# Patient Record
Sex: Male | Born: 1980 | Race: Black or African American | Hispanic: No | Marital: Single | Smoking: Current every day smoker
Health system: Southern US, Community
[De-identification: ages and names within clinical notes are randomized; demographics above are authoritative.]

## PROBLEM LIST (undated history)

## (undated) DIAGNOSIS — I498 Other specified cardiac arrhythmias: Secondary | ICD-10-CM

## (undated) DIAGNOSIS — R9431 Abnormal electrocardiogram [ECG] [EKG]: Secondary | ICD-10-CM

---

## 2001-08-27 ENCOUNTER — Emergency Department (HOSPITAL_COMMUNITY): Admission: EM | Admit: 2001-08-27 | Discharge: 2001-08-27 | Payer: Self-pay | Admitting: *Deleted

## 2002-10-02 ENCOUNTER — Emergency Department (HOSPITAL_COMMUNITY): Admission: EM | Admit: 2002-10-02 | Discharge: 2002-10-02 | Payer: Self-pay | Admitting: Emergency Medicine

## 2002-10-02 ENCOUNTER — Encounter: Payer: Self-pay | Admitting: Emergency Medicine

## 2008-07-07 ENCOUNTER — Emergency Department (HOSPITAL_COMMUNITY): Admission: EM | Admit: 2008-07-07 | Discharge: 2008-07-07 | Payer: Self-pay | Admitting: Emergency Medicine

## 2008-11-11 ENCOUNTER — Emergency Department (HOSPITAL_COMMUNITY): Admission: EM | Admit: 2008-11-11 | Discharge: 2008-11-11 | Payer: Self-pay | Admitting: Emergency Medicine

## 2010-04-25 ENCOUNTER — Inpatient Hospital Stay (HOSPITAL_COMMUNITY): Admission: AC | Admit: 2010-04-25 | Discharge: 2010-04-28 | Payer: Self-pay | Source: Home / Self Care

## 2010-05-03 ENCOUNTER — Emergency Department (HOSPITAL_COMMUNITY)
Admission: EM | Admit: 2010-05-03 | Discharge: 2010-05-04 | Payer: Self-pay | Source: Home / Self Care | Admitting: Emergency Medicine
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
## 2010-07-31 LAB — POCT I-STAT, CHEM 8
Hemoglobin: 16 g/dL (ref 13.0–17.0)
Potassium: 3.9 mEq/L (ref 3.5–5.1)
Sodium: 140 mEq/L (ref 135–145)
TCO2: 25 mmol/L (ref 0–100)

## 2010-07-31 LAB — URINALYSIS, ROUTINE W REFLEX MICROSCOPIC
Nitrite: NEGATIVE
Protein, ur: NEGATIVE mg/dL
Specific Gravity, Urine: 1.023 (ref 1.005–1.030)
Urobilinogen, UA: 1 mg/dL (ref 0.0–1.0)

## 2010-07-31 LAB — CBC
HCT: 44.4 % (ref 39.0–52.0)
Hemoglobin: 15.6 g/dL (ref 13.0–17.0)
RDW: 12.6 % (ref 11.5–15.5)
WBC: 10.3 10*3/uL (ref 4.0–10.5)

## 2010-07-31 LAB — DIFFERENTIAL
Basophils Absolute: 0 10*3/uL (ref 0.0–0.1)
Eosinophils Relative: 1 % (ref 0–5)
Lymphocytes Relative: 6 % — ABNORMAL LOW (ref 12–46)
Lymphs Abs: 0.6 10*3/uL — ABNORMAL LOW (ref 0.7–4.0)
Monocytes Absolute: 0.1 10*3/uL (ref 0.1–1.0)
Neutro Abs: 9.5 10*3/uL — ABNORMAL HIGH (ref 1.7–7.7)

## 2010-07-31 LAB — PROTIME-INR: Prothrombin Time: 12.6 seconds (ref 11.6–15.2)

## 2010-07-31 LAB — ETHANOL: Alcohol, Ethyl (B): <5 mg/dL (ref 0–10)

## 2010-07-31 LAB — URINE MICROSCOPIC-ADD ON

## 2010-07-31 LAB — RAPID URINE DRUG SCREEN, HOSP PERFORMED: Barbiturates: NOT DETECTED

## 2010-07-31 LAB — APTT: aPTT: 23 seconds — ABNORMAL LOW (ref 24–37)

## 2012-07-01 ENCOUNTER — Encounter (HOSPITAL_COMMUNITY): Payer: Self-pay | Admitting: Emergency Medicine

## 2012-07-01 ENCOUNTER — Emergency Department (INDEPENDENT_AMBULATORY_CARE_PROVIDER_SITE_OTHER)
Admission: EM | Admit: 2012-07-01 | Discharge: 2012-07-01 | Disposition: A | Discharge status: Home / Self Care | Payer: Self-pay | Source: Home / Self Care

## 2012-07-01 DIAGNOSIS — K130 Diseases of lips: Secondary | ICD-10-CM

## 2012-07-01 MED ORDER — CEFTRIAXONE SODIUM 1 G IJ SOLR
INTRAMUSCULAR | Status: AC
  Filled 2012-07-01: qty 10

## 2012-07-01 MED ORDER — HYDROCODONE-ACETAMINOPHEN 7.5-325 MG PO TABS: 1.0000 | ORAL_TABLET | ORAL | Status: DC | PRN

## 2012-07-01 MED ORDER — LIDOCAINE HCL (PF) 1 % IJ SOLN
INTRAMUSCULAR | Status: AC
  Filled 2012-07-01: qty 5

## 2012-07-01 MED ORDER — CEFTRIAXONE SODIUM 1 G IJ SOLR
1.0000 g | Freq: Once | INTRAMUSCULAR | Status: AC
  Administered 2012-07-01: 1 g via INTRAMUSCULAR

## 2012-07-01 MED ORDER — CLINDAMYCIN HCL 300 MG PO CAPS: 300.0000 mg | ORAL_CAPSULE | Freq: Two times a day (BID) | ORAL | Status: DC

## 2012-07-01 NOTE — ED Notes (Signed)
Discharge pending administration of antibiotic and post wait to ensure no adverse reaction

## 2012-07-01 NOTE — ED Provider Notes (Signed)
History     CSN: [id number]  Arrival date & time 07/01/12  1017   None     Chief Complaint  Patient presents with  . Oral Pain    (Consider location/radiation/quality/duration/timing/severity/associated sxs/prior treatment) HPI Comments: 32 year old man states he woke up 2 days ago with swelling and pain of the upper lip. It first started out as a small "bump". And it has enlarged over the past 24 hours. He is complaining of localized pain in the middle of the upper lip associated with swelling of the upper lip. pain radiates cephalad along the para nasal borders.  . There is no tenderness or swelling in the facial areas.   No past medical history on file.  No past surgical history on file.  No family history on file.  History  Substance Use Topics  . Smoking status: Not on file  . Smokeless tobacco: Not on file  . Alcohol Use: Not on file      Review of Systems  Constitutional: Negative.   HENT: Positive for facial swelling. Negative for congestion, sore throat, neck pain and neck stiffness.   Respiratory: Negative.   Skin: Negative.   Neurological: Negative.     Allergies  Review of patient's allergies indicates not on file.  Home Medications   Current Outpatient Rx  Name  Route  Sig  Dispense  Refill  . clindamycin (CLEOCIN) 300 MG capsule   Oral   Take 1 capsule (300 mg total) by mouth 2 (two) times daily. X 7 days   14 capsule   0     BP 131/85  Pulse 68  Temp(Src) 99.1 F (37.3 C) (Oral)  Resp 16  SpO2 100%  Physical Exam  Nursing note and vitals reviewed. Constitutional: He is oriented to person, place, and time. He appears well-developed and well-nourished.  HENT:  Head: Normocephalic and atraumatic.  Nose: Nose normal.  Mouth/Throat: Oropharynx is clear and moist. No oropharyngeal exudate.  There is no swelling, erythema or other abnormalities in the intraoral or pharyngeal areas and structures. No dental tenderness, no abscess formation  or dental pain. Airway is widely patent.  Eyes: Conjunctivae and EOM are normal. Pupils are equal, round, and reactive to light.  Neck: Normal range of motion. Neck supple.  Cardiovascular: Normal rate.   Pulmonary/Chest: Effort normal.  Lymphadenopathy:    He has no cervical adenopathy.  Neurological: He is alert and oriented to person, place, and time. He exhibits normal muscle tone.  Skin: Skin is warm and dry.  As in history of present illness. Swelling of the upper lip and induration in the middle aspect of the upper lip. At the superior vermilion border there is a 2 mm area of crusting. This most likely is the source of infection. No erythema or lymphangitis.  Psychiatric: He has a normal mood and affect.    ED Course  Procedures (including critical care time)  Labs Reviewed - No data to display No results found.   1. Lip abscess       MDM  The swelling involves most of the upper lip but the induration and tenderness is primarily in the middle aspect. It is so painful and tender it is difficult to touch and examined. I will first treat him with antibiotics, Rocephin 1 g IM and clindamycin 300 mg twice a day for 7 days. He is to apply warm compresses. If he is not improving in 2 days or this is getting worse in 24hrs he has been  instructed to go to the emergency department. This would be very difficult and painful to perform in the urgent care. I suspect anesthesia would be best performed with a nerve block rather than injection into the lip itself. And it certainly possibly he may need analgesics, possibly IV or even conscious sedation  to have this I&D.        Hayden Rasmussen, NP 07/01/12 1131

## 2012-07-01 NOTE — ED Notes (Signed)
Patient aware of post injection delay.  Patient sipping ginger ale

## 2012-07-01 NOTE — ED Provider Notes (Signed)
Medical screening examination/treatment/procedure(s) were performed by non-physician practitioner and as supervising physician I was immediately available for consultation/collaboration.  David Keller, M.D.  David C Keller, MD 07/01/12 2321 

## 2012-07-01 NOTE — ED Notes (Signed)
Mouth swelling for 2 days.  No tongue swelling or difficulty breathing.  No history of this swelling before.

## 2015-04-22 ENCOUNTER — Emergency Department (HOSPITAL_COMMUNITY): Payer: Self-pay

## 2015-04-22 ENCOUNTER — Encounter (HOSPITAL_COMMUNITY): Payer: Self-pay | Admitting: Family Medicine

## 2015-04-22 ENCOUNTER — Emergency Department (HOSPITAL_COMMUNITY)
Admission: EM | Admit: 2015-04-22 | Discharge: 2015-04-22 | Disposition: A | Discharge status: Home / Self Care | Payer: Self-pay | Attending: Emergency Medicine | Admitting: Emergency Medicine

## 2015-04-22 DIAGNOSIS — R51 Headache: Secondary | ICD-10-CM | POA: Insufficient documentation

## 2015-04-22 DIAGNOSIS — H53149 Visual discomfort, unspecified: Secondary | ICD-10-CM | POA: Insufficient documentation

## 2015-04-22 DIAGNOSIS — F17210 Nicotine dependence, cigarettes, uncomplicated: Secondary | ICD-10-CM | POA: Insufficient documentation

## 2015-04-22 DIAGNOSIS — R519 Headache, unspecified: Secondary | ICD-10-CM

## 2015-04-22 MED ORDER — KETOROLAC TROMETHAMINE 60 MG/2ML IM SOLN
60.0000 mg | Freq: Once | INTRAMUSCULAR | Status: DC
  Filled 2015-04-22: qty 2

## 2015-04-22 MED ORDER — METOCLOPRAMIDE HCL 5 MG/ML IJ SOLN
10.0000 mg | INTRAMUSCULAR | Status: AC
  Administered 2015-04-22: 10 mg via INTRAVENOUS
  Filled 2015-04-22: qty 2

## 2015-04-22 MED ORDER — DIPHENHYDRAMINE HCL 50 MG/ML IJ SOLN
25.0000 mg | Freq: Once | INTRAMUSCULAR | Status: AC
  Administered 2015-04-22: 25 mg via INTRAVENOUS
  Filled 2015-04-22: qty 1

## 2015-04-22 MED ORDER — KETOROLAC TROMETHAMINE 30 MG/ML IJ SOLN
30.0000 mg | Freq: Once | INTRAMUSCULAR | Status: AC
  Administered 2015-04-22: 30 mg via INTRAVENOUS
  Filled 2015-04-22: qty 1

## 2015-04-22 MED ORDER — BUTALBITAL-APAP-CAFFEINE 50-325-40 MG PO TABS: 1.0000 | ORAL_TABLET | Freq: Three times a day (TID) | ORAL | Status: AC | PRN

## 2015-04-22 MED ORDER — FLUTICASONE PROPIONATE 50 MCG/ACT NA SUSP
2.0000 | Freq: Every day | NASAL | Status: DC
  Administered 2015-04-22: 2 via NASAL
  Filled 2015-04-22: qty 16

## 2015-04-22 MED ORDER — CETIRIZINE HCL 10 MG PO TABS: 10.0000 mg | ORAL_TABLET | Freq: Every day | ORAL | Status: DC

## 2015-04-22 MED ORDER — PROCHLORPERAZINE EDISYLATE 5 MG/ML IJ SOLN
10.0000 mg | Freq: Once | INTRAMUSCULAR | Status: AC
  Administered 2015-04-22: 10 mg via INTRAVENOUS
  Filled 2015-04-22: qty 2

## 2015-04-22 NOTE — ED Notes (Signed)
Pt is complaining of a intermittent  headache that started 6 days ago. Pain describe as throbbing/. Pt has photosenitivity.

## 2015-04-22 NOTE — Discharge Instructions (Signed)
Sinus Headache A sinus headache occurs when the paranasal sinuses become clogged or swollen. Paranasal sinuses are air pockets within the bones of the face. Sinus headaches can range from mild to severe. CAUSES A sinus headache can result from various conditions that affect the sinuses, such as:  Colds.  Sinus infections.  Allergies. SYMPTOMS The main symptom of this condition is a headache that may feel like pain or pressure in the face, forehead, ears, or upper teeth. People who have a sinus headache often have other symptoms, such as:  Congested or runny nose.  Fever.  Inability to smell. Weather changes can make symptoms worse. DIAGNOSIS This condition may be diagnosed based on:  A physical exam and medical history.  Imaging tests, such as a CT scan and MRI, to check for problems with the sinuses.  A specialist may look into the sinuses with a tool that has a camera (endoscopy). TREATMENT Treatment for this condition depends on the cause.  Sinus pain that is caused by a sinus infection may be treated with antibiotic medicine.  Sinus pain that is caused by allergies may be helped by allergy medicines (antihistamines) and medicated nasal sprays.  Sinus pain that is caused by congestion may be helped by flushing the nose and sinuses with saline solution. HOME CARE INSTRUCTIONS  Take medicines only as directed by your health care provider.  If you were prescribed an antibiotic medicine, finish all of it even if you start to feel better.  If you have congestion, use a nasal spray to help reduce pressure.  If directed, apply a warm, moist washcloth to your face to help relieve pain. SEEK MEDICAL CARE IF:  You have headaches more than one time each week.  You have sensitivity to light or sound.  You have a fever.  You feel sick to your stomach (nauseous) or you throw up (vomit).  Your headaches do not get better with treatment. Many people think that they have a  sinus headache when they actually have migraines or tension headaches. SEEK IMMEDIATE MEDICAL CARE IF:  You have vision problems.  You have sudden, severe pain in your face or head.  You have a seizure.  You are confused.  You have a stiff neck.   This information is not intended to replace advice given to you by your health care provider. Make sure you discuss any questions you have with your health care provider.   Document Released: 05/14/2004 Document Revised: 08/21/2014 Document Reviewed: 04/02/2014 Elsevier Interactive Patient Education 2016 [employer]  

## 2015-04-22 NOTE — ED Provider Notes (Signed)
CSN: [id number]     Arrival date & time 04/22/15  8413 History   First MD Initiated Contact with Patient 04/22/15 0249     Chief Complaint  Patient presents with  . Headache     (Consider location/radiation/quality/duration/timing/severity/associated sxs/prior Treatment) HPI Comments: 35 year old male presents to the emergency department for evaluation of a right-sided temporal, nonradiating headache which began 6 days ago. Pain has been throbbing and intermittent. It has relieved slightly with ibuprofen, but will worsen when the medication wears off. Patient states that he has never had a similar headache in the past. He does report photosensitivity and denies fever, vision loss, tinnitus or hearing loss, extremity numbness or weakness, or nausea/vomiting. No neck stiffness or hx of head injury/trauma.  Patient is a 35 y.o. male presenting with headaches. The history is provided by the patient. No language interpreter was used.  Headache Associated symptoms: photophobia   Associated symptoms: no fever, no hearing loss, no neck pain and no vomiting     History reviewed. No pertinent past medical history. History reviewed. No pertinent past surgical history. History reviewed. No pertinent family history. Social History  Substance Use Topics  . Smoking status: Current Every Day Smoker -- 0.25 packs/day    Types: Cigarettes  . Smokeless tobacco: None  . Alcohol Use: Yes     Comment: "Every now and then"     Review of Systems  Constitutional: Negative for fever.  HENT: Negative for hearing loss and tinnitus.   Eyes: Positive for photophobia. Negative for visual disturbance.  Gastrointestinal: Negative for vomiting.  Musculoskeletal: Negative for neck pain.  Neurological: Positive for headaches.  All other systems reviewed and are negative.   Allergies  Review of patient's allergies indicates no known allergies.  Home Medications   Prior to Admission medications   Not on File    BP 103/69 mmHg  Pulse 64  Temp(Src) 98.2 F (36.8 C) (Oral)  Resp 20  SpO2 100%   Physical Exam  Constitutional: He is oriented to person, place, and time. He appears well-developed and well-nourished. No distress.  Nontoxic/nonseptic appearing  HENT:  Head: Normocephalic and atraumatic.  Mouth/Throat: Oropharynx is clear and moist. No oropharyngeal exudate.  Symmetric rise of the uvula with phonation  Eyes: Conjunctivae and EOM are normal. Pupils are equal, round, and reactive to light. No scleral icterus.  Pupils equal round and reactive to light bilaterally. EOMs normal.  Neck: Normal range of motion.  No nuchal rigidity or meningismus  Cardiovascular: Normal rate, regular rhythm and intact distal pulses.   Pulmonary/Chest: Effort normal. No respiratory distress. He has no wheezes.  Respirations even and unlabored  Musculoskeletal: Normal range of motion.  Neurological: He is alert and oriented to person, place, and time. No cranial nerve deficit. He exhibits normal muscle tone. Coordination normal.  GCS 15. Speech is goal oriented. No cranial nerve deficits appreciated; symmetric eyebrow raise, no facial drooping, tongue midline. Patient has equal grip strength bilaterally with 5/5 strength against resistance in all major muscle groups bilaterally. Sensation to light touch intact. Patient moves extremities without ataxia. He ambulates with steady gait.  Skin: Skin is warm and dry. No rash noted. He is not diaphoretic. No erythema. No pallor.  Psychiatric: He has a normal mood and affect. His behavior is normal.  Nursing note and vitals reviewed.   ED Course  Procedures (including critical care time) Labs Review Labs Reviewed - No data to display  Imaging Review Ct Head Wo Contrast  04/22/2015  CLINICAL DATA:  Acute onset of intermittent headache, worse on the right. Photophobia. Initial encounter. EXAM: CT HEAD WITHOUT CONTRAST TECHNIQUE: Contiguous axial images were  obtained from the base of the skull through the vertex without intravenous contrast. COMPARISON:  CT of the head performed 07/07/2008 FINDINGS: There is no evidence of acute infarction, mass lesion, or intra- or extra-axial hemorrhage on CT. The posterior fossa, including the cerebellum, brainstem and fourth ventricle, is within normal limits. The third and lateral ventricles, and basal ganglia are unremarkable in appearance. The cerebral hemispheres are symmetric in appearance, with normal gray-white differentiation. No mass effect or midline shift is seen. There is no evidence of fracture; there is incomplete fusion of the posterior arch of C1. The visualized portions of the orbits are within normal limits. A mucus retention cyst or polyp is noted at the right maxillary sinus. The remaining paranasal sinuses and mastoid air cells are well-aerated. No significant soft tissue abnormalities are seen. IMPRESSION: 1. No acute intracranial pathology seen on CT. 2. Mucus retention cyst or polyp at the right maxillary sinus. Electronically Signed   By: Roanna RaiderJeffery  Chang M.D.   On: 04/22/2015 06:04   I have personally reviewed and evaluated these images and lab results as part of my medical decision-making.   EKG Interpretation None      6:28 AM Patient reports improvement in his headache since receiving Toradol and Reglan. His CT shows mucus retention cyst or polyp at the right maxillary sinus without any other acute intracranial pathology. Will order Compazine and Benadryl for continued improvement as well as Flonase which the patient can take home with him. Anticipate discharge following further treatment.   Medications  ketorolac (TORADOL) 30 MG/ML injection 30 mg (not administered)  metoCLOPramide (REGLAN) injection 10 mg (not administered)  prochlorperazine (COMPAZINE) injection 10 mg (not administered)  diphenhydrAMINE (BENADRYL) injection 25 mg (not administered)  fluticasone (FLONASE) 50 MCG/ACT  nasal spray 2 spray (not administered)    MDM   Final diagnoses:  Sinus headache    35 year old male presents to the emergency department for evaluation of an intermittent headache over the past 6 days. His neurologic exam is nonfocal. He has no fever, nuchal rigidity, or meningismus to suggest meningitis. Symptoms improved with Toradol and Reglan. Patient requesting a CT scan which shows no acute intracranial pathology. There is evidence of mucus retention cyst versus polyp in the right maxillary sinus. Will treat for sinus headache with Zyrtec and Fioricet as an outpatient. Patient referred to his primary care doctor for follow-up. Return precautions given at discharge. Patient discharged in satisfactory condition with no unaddressed concerns.   Filed Vitals:   04/22/15 0241 04/22/15 0640  BP: 103/69 107/79  Pulse: 64 59  Temp: 98.2 F (36.8 C)   TempSrc: Oral   Resp: 20 16  SpO2: 100% 99%       Antony MaduraKelly Demontrez Rindfleisch, PA-C 04/22/15 [id number]  April Palumbo, MD 04/22/15 [phone]

## 2015-04-22 NOTE — ED Notes (Signed)
Pt refused IM injection  Pt states he wants po medication and wants his head scanned to find out what is causing his headaches  Pt states something is not right

## 2016-03-25 IMAGING — CT CT HEAD W/O CM
2 series · 15 of 30 positions shown, 19 images · non-contrast
Comparison: CT of the head performed 07/07/2008

CLINICAL DATA: Acute onset of intermittent headache, worse on the
right. Photophobia. Initial encounter.

EXAM:
CT HEAD WITHOUT CONTRAST
TECHNIQUE: Contiguous axial images were obtained from the base of the skull
through the vertex without intravenous contrast.

[Series 2: head w/o · axial · non-contrast · 0.45mm/px · z∈[-233,-98]mm · 13 of 33 slices shown, 17 images]
[im 3/33  brain]
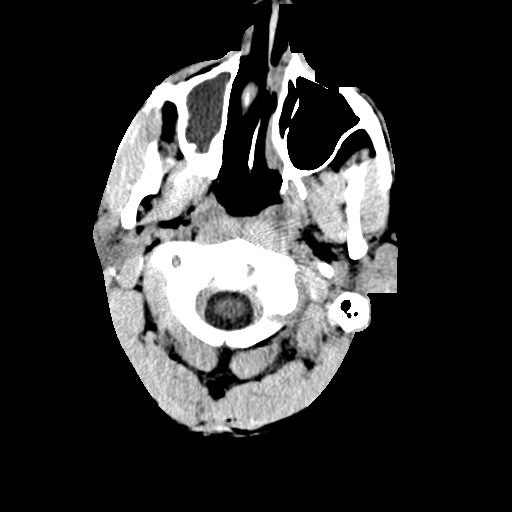
[im 3/33  bone]
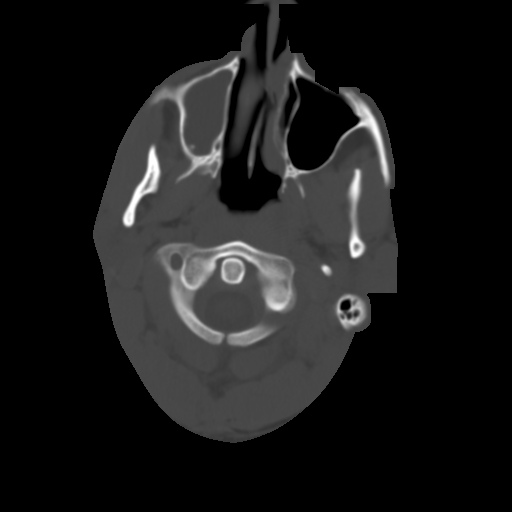
[im 5/33  brain]
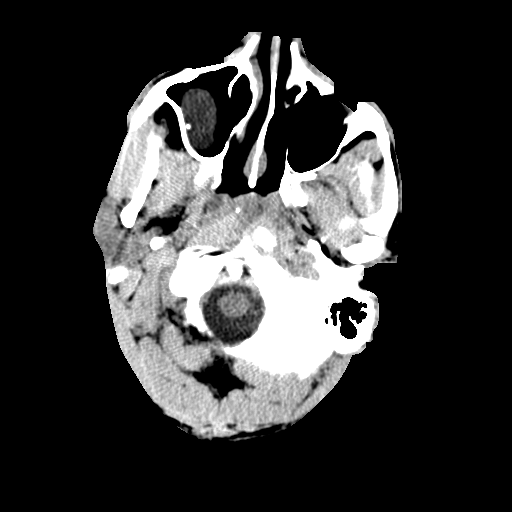
[im 7/33  brain]
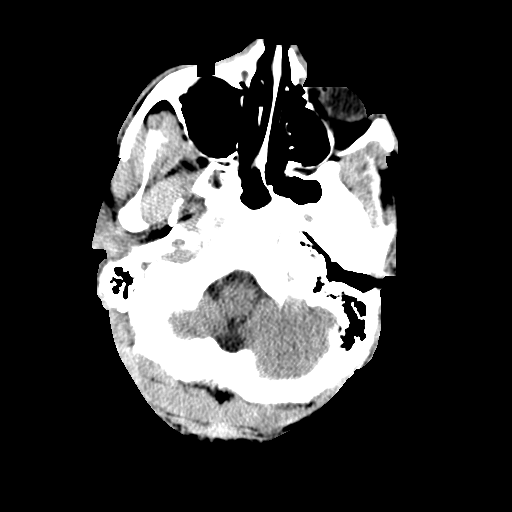
[im 10/33  brain]
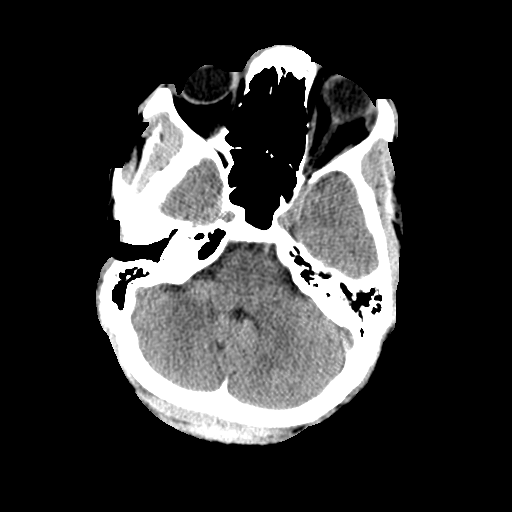
[im 12/33  brain]
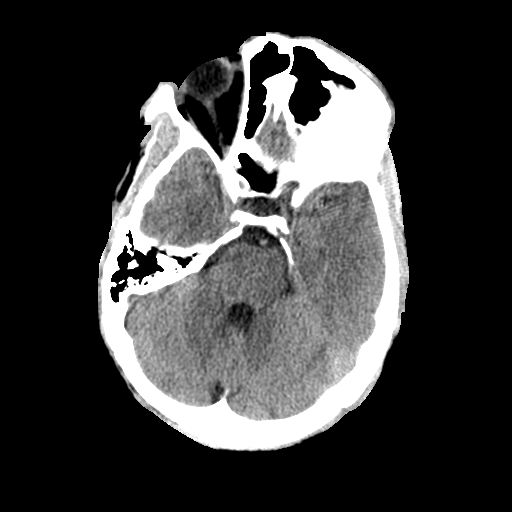
[im 12/33  bone]
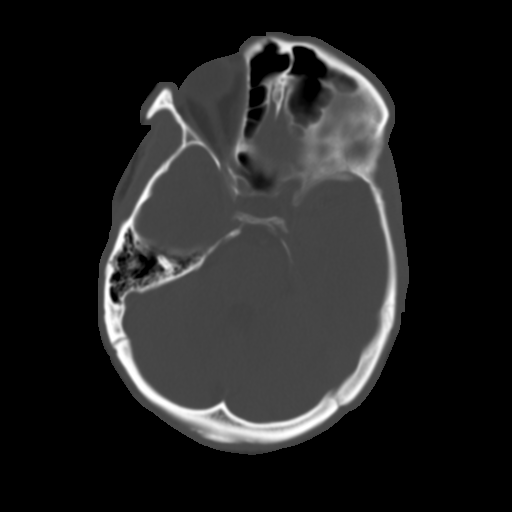
[im 14/33  brain]
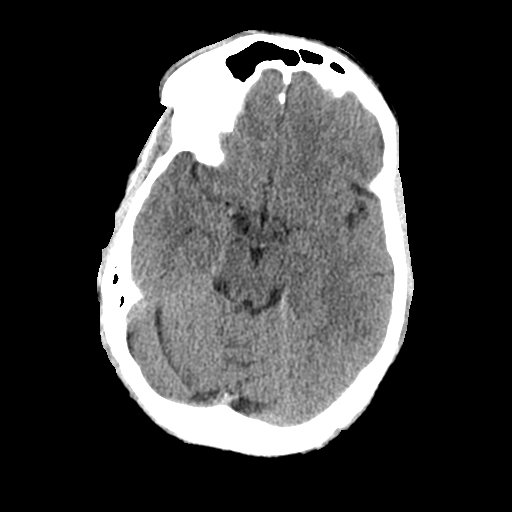
[im 17/33  brain]
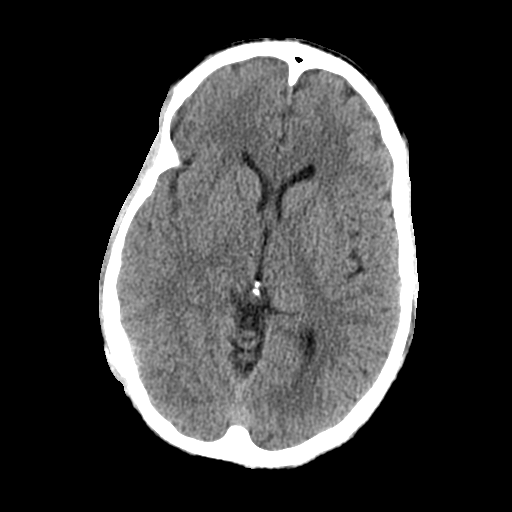
[im 19/33  brain]
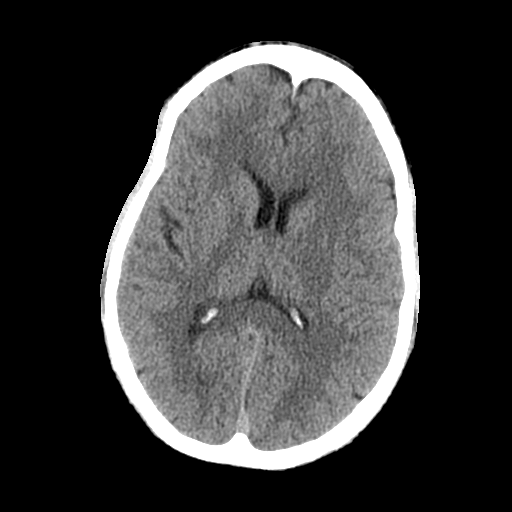
[im 21/33  brain]
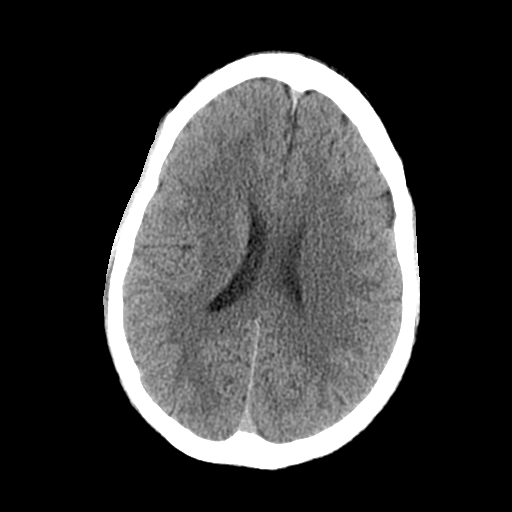
[im 21/33  bone]
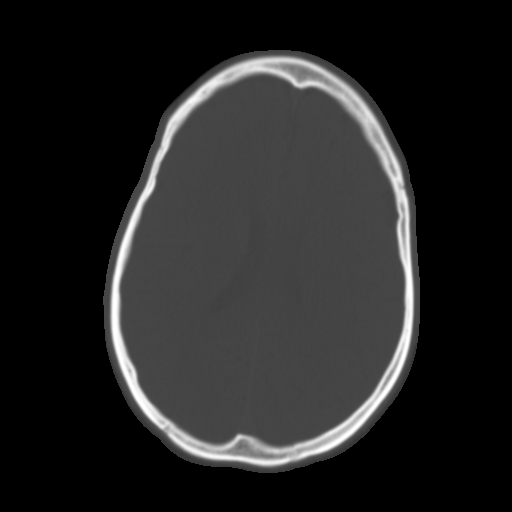
[im 23/33  brain]
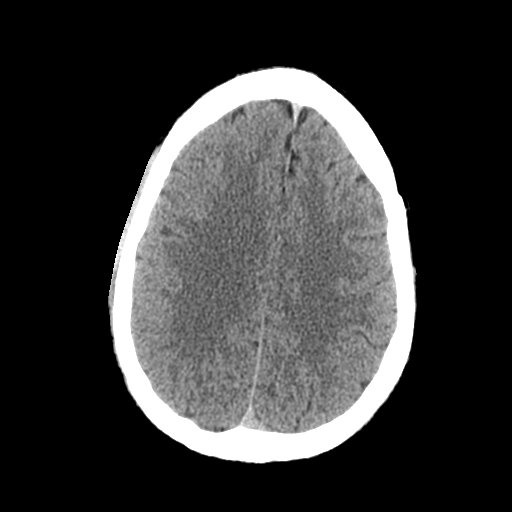
[im 26/33  brain]
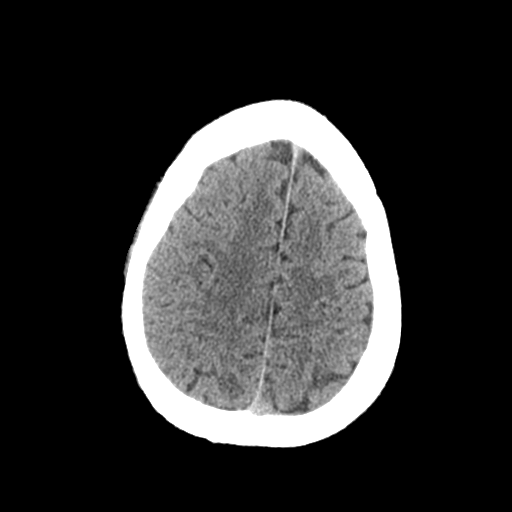
[im 28/33  brain]
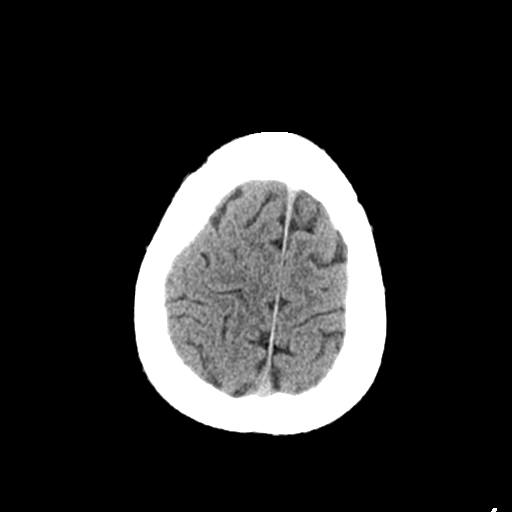
[im 30/33  brain]
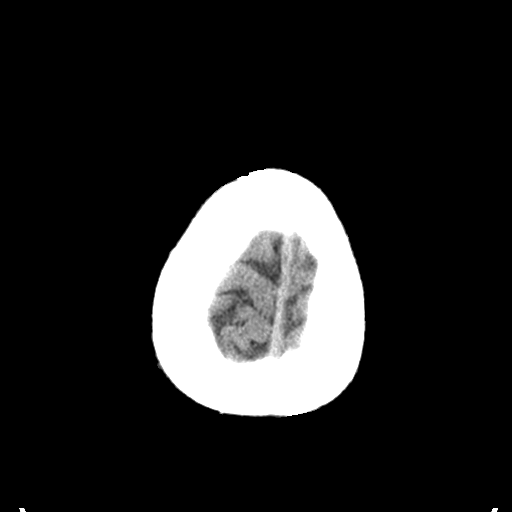
[im 30/33  bone]
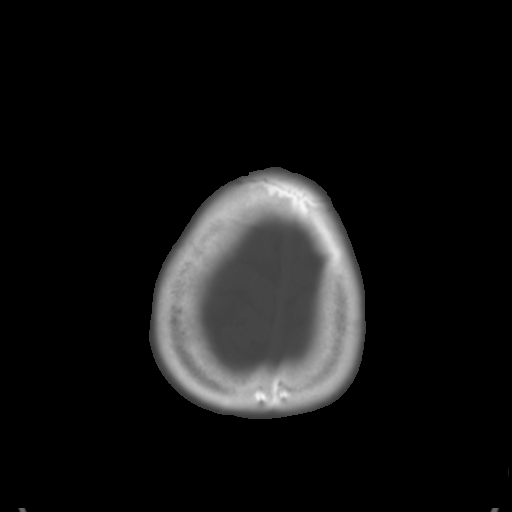

[Series 3: bone windows · axial · 0.45mm/px · z∈[-233,-213]mm · 2 of 33 slices shown]
[im 3/33  bone]
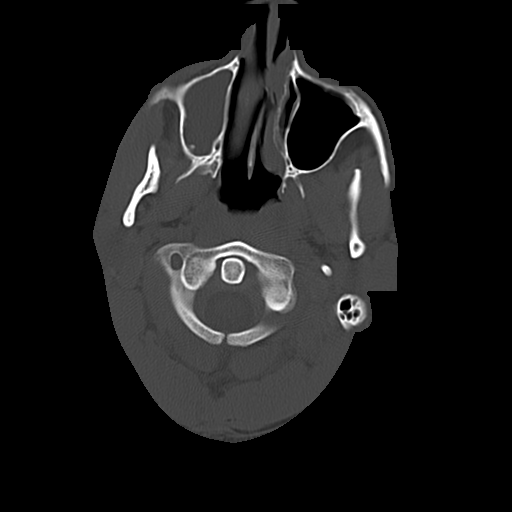
[im 7/33  bone]
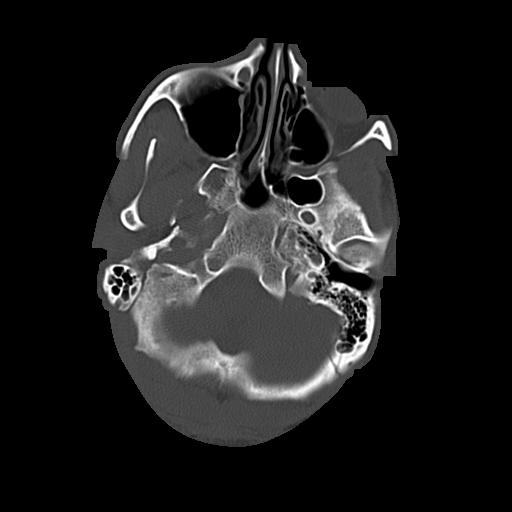

[15 of 30 positions shown; findings below may reference images not displayed]

FINDINGS: There is no evidence of acute infarction, mass lesion, or intra- or
extra-axial hemorrhage on CT.

The posterior fossa, including the cerebellum, brainstem and fourth
ventricle, is within normal limits. The third and lateral
ventricles, and basal ganglia are unremarkable in appearance. The
cerebral hemispheres are symmetric in appearance, with normal
gray-white differentiation. No mass effect or midline shift is seen.

There is no evidence of fracture; there is incomplete fusion of the
posterior arch of C1. The visualized portions of the orbits are
within normal limits. A mucus retention cyst or polyp is noted at
the right maxillary sinus. The remaining paranasal sinuses and
mastoid air cells are well-aerated. No significant soft tissue
abnormalities are seen.
IMPRESSION: 1. No acute intracranial pathology seen on CT.
2. Mucus retention cyst or polyp at the right maxillary sinus.

## 2018-03-03 ENCOUNTER — Emergency Department (HOSPITAL_COMMUNITY)
Admission: EM | Admit: 2018-03-03 | Discharge: 2018-03-03 | Disposition: A | Discharge status: Home / Self Care | Payer: Self-pay | Attending: Emergency Medicine | Admitting: Emergency Medicine

## 2018-03-03 ENCOUNTER — Encounter (HOSPITAL_COMMUNITY): Payer: Self-pay

## 2018-03-03 DIAGNOSIS — F17210 Nicotine dependence, cigarettes, uncomplicated: Secondary | ICD-10-CM | POA: Insufficient documentation

## 2018-03-03 DIAGNOSIS — Z79899 Other long term (current) drug therapy: Secondary | ICD-10-CM | POA: Insufficient documentation

## 2018-03-03 DIAGNOSIS — R9431 Abnormal electrocardiogram [ECG] [EKG]: Secondary | ICD-10-CM | POA: Insufficient documentation

## 2018-03-03 HISTORY — DX: Abnormal electrocardiogram (ECG) (EKG): R94.31

## 2018-03-03 LAB — CBC
HCT: 42.9 % (ref 39.0–52.0)
HEMOGLOBIN: 13.6 g/dL (ref 13.0–17.0)
MCH: 30.2 pg (ref 26.0–34.0)
MCHC: 31.7 g/dL (ref 30.0–36.0)
MCV: 95.3 fL (ref 80.0–100.0)
NRBC: 0 % (ref 0.0–0.2)
PLATELETS: 259 10*3/uL (ref 150–400)
RBC: 4.5 MIL/uL (ref 4.22–5.81)
RDW: 11.9 % (ref 11.5–15.5)
WBC: 4.2 10*3/uL (ref 4.0–10.5)

## 2018-03-03 LAB — BASIC METABOLIC PANEL
ANION GAP: 8 (ref 5–15)
BUN: 10 mg/dL (ref 6–20)
CO2: 24 mmol/L (ref 22–32)
Calcium: 9.1 mg/dL (ref 8.9–10.3)
Chloride: 106 mmol/L (ref 98–111)
Creatinine, Ser: 1.16 mg/dL (ref 0.61–1.24)
GLUCOSE: 103 mg/dL — AB (ref 70–99)
POTASSIUM: 4.2 mmol/L (ref 3.5–5.1)
SODIUM: 138 mmol/L (ref 135–145)

## 2018-03-03 LAB — I-STAT TROPONIN, ED: TROPONIN I, POC: 0 ng/mL (ref 0.00–0.08)

## 2018-03-03 NOTE — ED Notes (Signed)
Patient verbalizes understanding of discharge instructions. Opportunity for questioning and answers were provided. Armband removed by staff, pt discharged from ED.  

## 2018-03-03 NOTE — ED Provider Notes (Signed)
[facility] EMERGENCY DEPARTMENT Provider Note   CSN: [id number] Arrival date & time: 03/03/18  7829     History   Chief Complaint Chief Complaint  Patient presents with  . Abnormal ECG    HPI [name] [name] is a 37 y.o. male.  HPI   Patient is a 37 year old male with no known past medical history who presents the emergency department today for evaluation of an abnormal ECG.  Patient states that he was seen at a clinic for a medical trial and had an EKG done on 11/2 and 11/12 of this year.  At this visit he was told that he had an EKG abnormality and was advised to come to the ED immediately.  He states that he was unable to come to the ED until today.  He denies any current chest pain, shortness of breath or other symptoms.  He states he occasionally gets midsternal chest pain that he attributes to acid reflux.  It only occurs when he eats.  He denies exertional chest pain.  He denies any early known family history of cardiac disease or sudden death.  States that he uses marijuana and alcohol but no other illicit substances.  States that he used to play sports as a child and did not have any symptoms when playing sports.  History reviewed. No pertinent past medical history.  There are no active problems to display for this patient.   History reviewed. No pertinent surgical history.    Home Medications    Prior to Admission medications   Medication Sig Start Date End Date Taking? Authorizing Provider  cetirizine (ZYRTEC ALLERGY) 10 MG tablet Take 1 tablet (10 mg total) by mouth daily. 04/22/15   Antony Madura, PA-C    Family History No family history on file.  Social History Social History   Tobacco Use  . Smoking status: Current Every Day Smoker    Packs/day: 0.25    Types: Cigarettes  Substance Use Topics  . Alcohol use: Yes    Comment: "Every now and then"   . Drug use: Yes    Types: Marijuana     Allergies   Bee venom   Review of  Systems Review of Systems  Constitutional: Negative for fever.  HENT: Negative for congestion, rhinorrhea and sore throat.   Eyes: Negative for visual disturbance.  Respiratory: Negative for shortness of breath.   Cardiovascular: Negative for chest pain and leg swelling.  Gastrointestinal: Negative for abdominal pain, constipation, diarrhea, nausea and vomiting.  Genitourinary: Negative for dysuria.  Musculoskeletal: Negative for back pain.  Skin: Negative for rash.  Neurological: Negative for headaches.    Physical Exam Updated Vital Signs BP 115/78   Pulse 62   Temp 97.7 F (36.5 C) (Oral)   Resp 18   Ht 5' 6.5" (1.689 m)   Wt 81.6 kg   SpO2 100%   BMI 28.62 kg/m   Physical Exam  Constitutional: He appears well-developed and well-nourished. No distress.  HENT:  Head: Normocephalic and atraumatic.  Eyes: Conjunctivae are normal.  Neck: Neck supple.  Cardiovascular: Normal rate, regular rhythm and normal heart sounds.  No murmur heard. Pulmonary/Chest: Effort normal and breath sounds normal. No stridor. No respiratory distress. He has no wheezes.  Abdominal: Soft. Bowel sounds are normal. There is no tenderness.  Musculoskeletal: He exhibits no edema.  Neurological: He is alert.  Skin: Skin is warm and dry.  Psychiatric: He has a normal mood and affect.  Nursing note and vitals  reviewed.   ED Treatments / Results  Labs (all labs ordered are listed, but only abnormal results are displayed) Labs Reviewed  BASIC METABOLIC PANEL - Abnormal; Notable for the following components:      Result Value   Glucose, Bld 103 (*)    All other components within normal limits  CBC  I-STAT TROPONIN, ED    EKG EKG Interpretation  Date/Time:  Thursday March 03 2018 08:23:35 EST Ventricular Rate:  63 PR Interval:    QRS Duration: 98 QT Interval:  390 QTC Calculation: 400 R Axis:   46 Text Interpretation:  Sinus rhythm RSR' in V1 or V2, probably normal variant V1-V2 with  changes possible consistent with brugada type pattern Baseline wander in lead(s) V5 Reconfirmed by Virgina NorfolkAdam, Curatolo [phone]([id number]) on 03/03/2018 8:40:28 AM          Radiology No results found.  Procedures Procedures (including critical care time)  Medications Ordered in ED Medications - No data to display   Initial Impression / Assessment and Plan / ED Course  I have reviewed the triage vital signs and the nursing notes.  Pertinent labs & imaging results that were available during my care of the patient were reviewed by me and considered in my medical decision making (see chart for details).     Final Clinical Impressions(s) / ED Diagnoses   Final diagnoses:  Abnormal ECG   Pt presenting for evaluation of abnormal ecg that was found while he was undergoing a clinical trial. Pt was advised to come to the ED immediately when he had the ecg completed 2 days ago however he states he did not have time to do so until today.   Pt denies current chest pain, exertional chest pain, syncope, sob, diaphoresis or there sxs. Exam is very reassuring.   He denies ever having a syncopal episode with exercise. Denies a family history of early death or early heart disease. Denies h/o HTN, HLD, DM, or tobacco use. Report marijuana use and ETOH use, but denies other illicit substance use.   Labs reassuring, no anemia, normal electrolytes, normal troponin. EKG today with Sinus rhythm RSR' in V1 or V2, probably normal variant V1-V2 with changes possible consistent with brugada type pattern Baseline wander in lead(s) V5.  Prior ECGs reviewed and are placed in chart.   9:17 PM CONSULT with cardiology, Dr. Delton SeeNelson reviewed the EKG from today and from 11/2 and 11/12 and she does not feel that the EKGs are consistent with brugada syndome. She feels that the patient is appropriate for outpatient cardiac follow up. Cardiology will place f/u information in the patients AVS.   Patient advised to follow-up in  cardiology office tomorrow as scheduled.  Advised on strict return precautions.  He voices understanding of the plan and reasons to return to ED.  All questions answered.  ED Discharge Orders    None       Karrie MeresCouture, Erron Wengert S, PA-C 03/03/18 [id number]    Virgina NorfolkCuratolo, Adam, DO 03/03/18 1820

## 2018-03-03 NOTE — ED Triage Notes (Addendum)
Pt from home; Pt participating in a clinical trial [facility]; pt sent for further evaluation after abnormal EKG taken 2 days ago; denies CP, dizziness, SOB currently; endorses occasional burning CP, last episode approx 1 month ago

## 2018-03-03 NOTE — ED Provider Notes (Signed)
Medical screening examination/treatment/procedure(s) were conducted as a shared visit with non-physician practitioner(s) and myself.  I personally evaluated the patient during the encounter. Briefly, the patient is a 37 y.o. male no significant medical history presents to the ED after having concerning EKG.  Patient denies any chest pain, shortness of breath, dizziness.  Patient had abnormal EKG performed at clinic several days ago and was told to come for immediate evaluation but he waited.  Patient has 2 EKGs with him upon arrival that have some features that are concerning for Brugada syndrome.  Repeat EKG here also with possible concerns for Brugada with changes in V1 and V2.  Patient asymptomatic at this time.  Exam is unremarkable.  Clear breath sounds.  No obvious murmur.  Patient denies any significant family history of sudden cardiac death.  He states that he has been active all of his life without any symptoms.  Will obtain cardiology consultation given EKG findings.  Basic labs were ordered.  Patient with unremarkable lab work.  Troponin within normal limits.  No significant electrolyte abnormality.  Cardiology was consulted and they will follow-up outpatient.  At this time they do not believe EKG shows Brugada pattern.  Patient discharged in ED in good condition.  Understands follow-up and given return precautions.  This chart was dictated using voice recognition software.  Despite best efforts to proofread,  errors can occur which can change the documentation meaning.    EKG Interpretation  Date/Time:  Thursday March 03 2018 08:23:35 EST Ventricular Rate:  63 PR Interval:    QRS Duration: 98 QT Interval:  390 QTC Calculation: 400 R Axis:   46 Text Interpretation:  Sinus rhythm RSR' in V1 or V2, probably normal variant V1-V2 with changes possible consistent with brugada type pattern Baseline wander in lead(s) V5 Reconfirmed by Virgina NorfolkAdam, Jalen Daluz [phone]([id number]) on 03/03/2018 8:40:28 AM            Virgina Norfolkuratolo, Fidelis Loth, DO 03/03/18 [id number]

## 2018-03-03 NOTE — Discharge Instructions (Signed)
Please follow-up with Dr. Graciela HusbandsKlein tomorrow in the office as directed on your discharge paperwork.  Please return to the emergency department for any chest pain, shortness of breath, passing out, or any new or worsening symptoms.

## 2018-03-04 ENCOUNTER — Ambulatory Visit (INDEPENDENT_AMBULATORY_CARE_PROVIDER_SITE_OTHER): Payer: Self-pay | Admitting: Internal Medicine

## 2018-03-04 ENCOUNTER — Encounter: Payer: Self-pay | Admitting: Internal Medicine

## 2018-03-04 VITALS — BP 108/78 | HR 62 | Ht 66.5 in | Wt 178.8 lb

## 2018-03-04 DIAGNOSIS — R55 Syncope and collapse: Secondary | ICD-10-CM

## 2018-03-04 NOTE — Patient Instructions (Signed)
Medication Instructions:  Your physician recommends that you continue on your current medications as directed. Please refer to the Current Medication list given to you today.  Labwork: None ordered.  Testing/Procedures: None ordered.  Follow-Up: Your physician recommends that you schedule a follow-up appointment in:   Dr Graciela HusbandsKlein will be in contact with you.   Any Other Special Instructions Will Be Listed Below (If Applicable).     If you need a refill on your cardiac medications before your next appointment, please call your pharmacy.

## 2018-03-04 NOTE — Progress Notes (Signed)
ELECTROPHYSIOLOGY CONSULT NOTE  Patient ID: [name] [name], MRN: [id number], DOB/AGE: Aug 26, 1980 37 y.o. Admit date: (Not on file) Date of Consult: 03/04/2018  Primary Physician: Patient, No Pcp Per Primary Cardiologist: Macario Shear is a 37 y.o. male who is being seen today for the evaluation of abnormal ECG at the request of A Curatolo    HPI [name] [name] is a 37 y.o. male who was referred to the emergency room because of an abnormal ECG obtained as part of the study that was read by the computer is concerning for Brugada syndrome.  Repeat ECG in the ER was also consistent with a type II Brugada ECG.  He was referred for further evaluation.  Approximately a year and a half ago he had a syncopal episode.  He was sitting next to his girlfriend, leaning forward on his arms.  He collapsed forward without warning and about 5 seconds later aroused confused as to why she was stirring him.  No residual and no similar antecedent events.  No family history of sudden death.  He uses some alcohol and some marijuana. Evaluation. Past Medical History:  Diagnosis Date  . Abnormal EKG       Surgical History: History reviewed. No pertinent surgical history.   Home Meds: Current Meds  Medication Sig  . cetirizine (ZYRTEC ALLERGY) 10 MG tablet Take 1 tablet (10 mg total) by mouth daily.    Allergies:  Allergies  Allergen Reactions  . Bee Venom Anaphylaxis    Social History   Socioeconomic History  . Marital status: Single    Spouse name: Not on file  . Number of children: Not on file  . Years of education: Not on file  . Highest education level: Not on file  Occupational History  . Not on file  Social Needs  . Financial resource strain: Not on file  . Food insecurity:    Worry: Not on file    Inability: Not on file  . Transportation needs:    Medical: Not on file    Non-medical: Not on file  Tobacco Use  . Smoking status: Current Every Day Smoker   Packs/day: 0.25    Types: Cigarettes  . Smokeless tobacco: Never Used  Substance and Sexual Activity  . Alcohol use: Yes    Comment: "Every now and then"   . Drug use: Yes    Types: Marijuana  . Sexual activity: Not on file  Lifestyle  . Physical activity:    Days per week: Not on file    Minutes per session: Not on file  . Stress: Not on file  Relationships  . Social connections:    Talks on phone: Not on file    Gets together: Not on file    Attends religious service: Not on file    Active member of club or organization: Not on file    Attends meetings of clubs or organizations: Not on file    Relationship status: Not on file  . Intimate partner violence:    Fear of current or ex partner: Not on file    Emotionally abused: Not on file    Physically abused: Not on file    Forced sexual activity: Not on file  Other Topics Concern  . Not on file  Social History Narrative  . Not on file     History reviewed. No pertinent family history.   ROS:  Please see the history of present illness.  All other systems reviewed and negative.    Physical Exam:  Blood pressure 108/78, pulse 62, height 5' 6.5" (1.689 m), weight 178 lb 12.8 oz (81.1 kg), SpO2 99 %. General: Well developed, well nourished male in no acute distress. Head: Normocephalic, atraumatic, sclera non-icteric, no xanthomas, nares are without discharge. EENT: normal  Lymph Nodes:  none Neck: Negative for carotid bruits. JVD not elevated. Back:without scoliosis kyphosis Lungs: Clear bilaterally to auscultation without wheezes, rales, or rhonchi. Breathing is unlabored. Heart: RRR with S1 S2. No  murmur . No rubs, or gallops appreciated. Abdomen: Soft, non-tender, non-distended with normoactive bowel sounds. No hepatomegaly. No rebound/guarding. No obvious abdominal masses. Msk:  Strength and tone appear normal for age. Extremities: No clubbing or cyanosis. No edema.  Distal pedal pulses are 2+ and equal  bilaterally. Skin: Warm and Dry Neuro: Alert and oriented X 3. CN III-XII intact Grossly normal sensory and motor function . Psych:  Responds to questions appropriately with a normal affect.      Labs: Cardiac Enzymes No results for input(s): CKTOTAL, CKMB, TROPONINI in the last 72 hours. CBC Lab Results  Component Value Date   WBC 4.2 03/03/2018   HGB 13.6 03/03/2018   HCT 42.9 03/03/2018   MCV 95.3 03/03/2018   PLT 259 03/03/2018   PROTIME: No results for input(s): LABPROT, INR in the last 72 hours. Chemistry  Recent Labs  Lab 03/03/18 0826  NA 138  K 4.2  CL 106  CO2 24  BUN 10  CREATININE 1.16  CALCIUM 9.1  GLUCOSE 103*   Lipids No results found for: CHOL, HDL, LDLCALC, TRIG BNP No results found for: PROBNP Thyroid Function Tests: No results for input(s): TSH, T4TOTAL, T3FREE, THYROIDAB in the last 72 hours.  Invalid input(s): FREET3 Miscellaneous No results found for: DDIMER  Radiology/Studies:  No results found.  EKG:  11/14 Personally reviewed  Sinus @ 63 16/10/39 Downsloping ST elevation V1 [place]back V2  11/12  Similar  11/2 similar   ECG 03/05/2015: 20 leads in the second intercostal space Downsloping ST segment elevation of only 1 mm however  Assessment and Plan: Brugada type II ECG  Syncopal episode abrupt onset   I will need to do further investigation.  The patient's syncopal episode is strongly suggestive of an arrhythmic event.  Reviewing the [place] the ESC guidelines published in 2013 and 15 respectively syncope as a indication for ICD implantation is only with a spontaneous type I ECG, not the case here.  Document published 2018 ordered update on Brugada syndrome "an ICD should always be implanted in symptomatic patients (i.e.. none vagal syncope.)"  I do not know that there is sufficient consensus as to the role of EP testing for further risk stratification.  More than 50% of 80 min was spent in counseling related to the  above    Sherryl MangesSteven

## 2018-03-07 ENCOUNTER — Encounter: Payer: Self-pay | Admitting: Internal Medicine

## 2018-03-07 NOTE — Progress Notes (Unsigned)
Reached out to Dr Allen DerryAA Wilde and S Priori Trying to get advice as to how to proceed.  Both made the comment that in the absence of a type I ECG the implications of the syncopal event are not relevant in the context of Brugada syndrome.  Hence, pharmacological testing for provocation is indicated.  In the [place] that is the procainamide protocol--10 mg/kg over 20 minutes.  I.e. 800 mg.  Both also suggested that a LINQ might be useful to try to distinguish mechanisms of syncope.  There was some distant agreement as to the role of electrical testing; Dr. Christel MormonSP suggested that this might be useful for risk stratification and with inducibility and the history consistent with arrhythmic syncope and ICD would be appropriate.  Dr. Lennox SoldersAW also concurred that in the context of a diagnosis of Brugada syndrome and a history of arrhythmic syncope and ICD or LINQ may be appropriate

## 2018-03-09 ENCOUNTER — Telehealth: Payer: Self-pay

## 2018-03-09 NOTE — Telephone Encounter (Signed)
Pt aware of 9:30am arrival time to short stay for procainamide challenge and possible EP study later that afternoon. Pt was given pre procedural instructions: Light bfast ok before 6am, take normal medications, [facility] Pt had no additional questions and will call if there are any changes.

## 2018-03-11 ENCOUNTER — Encounter (HOSPITAL_COMMUNITY)
Admission: RE | Disposition: A | Discharge status: Home / Self Care | Payer: Self-pay | Source: Ambulatory Visit | Attending: Internal Medicine

## 2018-03-11 ENCOUNTER — Other Ambulatory Visit: Payer: Self-pay

## 2018-03-11 ENCOUNTER — Ambulatory Visit (HOSPITAL_COMMUNITY)
Admission: RE | Admit: 2018-03-11 | Discharge: 2018-03-11 | Disposition: A | Discharge status: Home / Self Care | Payer: Self-pay | Source: Ambulatory Visit | Attending: Internal Medicine | Admitting: Internal Medicine

## 2018-03-11 DIAGNOSIS — R55 Syncope and collapse: Secondary | ICD-10-CM | POA: Insufficient documentation

## 2018-03-11 DIAGNOSIS — R9431 Abnormal electrocardiogram [ECG] [EKG]: Secondary | ICD-10-CM

## 2018-03-11 HISTORY — PX: LOOP RECORDER INSERTION: EP1214

## 2018-03-11 HISTORY — PX: ELECTROPHYSIOLOGY STUDY: EP1205

## 2018-03-11 SURGERY — ELECTROPHYSIOLOGY STUDY

## 2018-03-11 MED ORDER — PROCAINAMIDE HCL 100 MG/ML IJ SOLN
800.0000 mg | Freq: Once | INTRAVENOUS | Status: DC
  Filled 2018-03-11: qty 8

## 2018-03-11 MED ORDER — LIDOCAINE-EPINEPHRINE 1 %-1:100000 IJ SOLN
INTRAMUSCULAR | Status: AC
  Filled 2018-03-11: qty 1

## 2018-03-11 MED ORDER — SODIUM CHLORIDE 0.9 % IV SOLN
INTRAVENOUS | Status: DC
  Administered 2018-03-11: 11:00:00 via INTRAVENOUS

## 2018-03-11 MED ORDER — PROCAINAMIDE HCL 100 MG/ML IJ SOLN
1000.0000 mg | Freq: Once | INTRAVENOUS | Status: AC
  Administered 2018-03-11: 1000 mg via INTRAVENOUS
  Filled 2018-03-11: qty 10

## 2018-03-11 MED ORDER — LIDOCAINE-EPINEPHRINE 1 %-1:100000 IJ SOLN
INTRAMUSCULAR | Status: DC | PRN
  Administered 2018-03-11: 30 mL

## 2018-03-11 SURGICAL SUPPLY — 3 items
LOOP REVEAL LINQSYS (Prosthesis & Implant Heart) ×2 IMPLANT
PACK LOOP INSERTION (CUSTOM PROCEDURE TRAY) ×2 IMPLANT
PAD PRO RADIOLUCENT 2001M-C (PAD) ×2 IMPLANT

## 2018-03-11 NOTE — Progress Notes (Signed)
EKG performed at 1146.  No change in QRS duration noted.

## 2018-03-11 NOTE — Progress Notes (Signed)
D/C'ed from Holding w/family member present. [facility]

## 2018-03-11 NOTE — Progress Notes (Signed)
D/C instructions reviewed w/patient and copy given to patient.

## 2018-03-11 NOTE — Progress Notes (Addendum)
PR interval  194 QRS            96 QT/QTc      422/455  No ectopy noted.   10 minutes

## 2018-03-11 NOTE — Progress Notes (Signed)
Dr. Graciela HusbandsKlein at the bedside.  All EKGs were reviewed. Dr. Graciela HusbandsKlein discussed implanting a loop recorder with the patient.  The patient has agreed.  Consent will be obtained.

## 2018-03-11 NOTE — Progress Notes (Signed)
Pharmacy is awaiting drip.

## 2018-03-11 NOTE — Progress Notes (Addendum)
PR          QRS       92ms QT/QTc  396/436  No ectopy noted.  40 minutes

## 2018-03-11 NOTE — Progress Notes (Addendum)
PR      192ms QRS   92ms QT/QTc  350/35083ms  No ectopy noted.    30 minutes

## 2018-03-11 NOTE — Progress Notes (Signed)
At bedside with pt.  Obtained baseline EKG.  Two pharmacists at bedside to verify infusion.  Pt has been placed on defib. Pads and oxygen.  Crash cart is in holding area and ambu. [facility]

## 2018-03-11 NOTE — Progress Notes (Signed)
Procainamide drip started at 1136.

## 2018-03-11 NOTE — Progress Notes (Addendum)
Order dose was 10 mg/kg (pt wt 81/6 kg) so dose rounded to 800 mg.  Order placed for 800 mg/200 mL infusion to be given over 20 minutes - pump unable to be programmed for patient specific dose. Order placed for 1 g/250 mL; however, math completed to ensure only 200 mL infused to equal 40 mg/min for 20 minutes. PharmD at bedside for infusion.  Sherron MondayKimberly Linzee Depaul, PharmD Clinical Pharmacist  Pager: [phone] Phone: [phone]

## 2018-03-11 NOTE — Progress Notes (Addendum)
PR      QRS   90ms QT/QTc   394/416   60 minutes

## 2018-03-11 NOTE — Progress Notes (Signed)
PR interval   QRS             98ms QT/QTc        420/462  No ectopy noted

## 2018-03-11 NOTE — Progress Notes (Signed)
Dr. Graciela HusbandsKlein at bedside and reviewed EKGs

## 2018-03-11 NOTE — Progress Notes (Signed)
PR  QRS QT/QTc  400/412  No ectopy noted.  70 minutes

## 2018-03-11 NOTE — Discharge Instructions (Signed)
Implant site care instructions °Keep incision clean and dry for 3 days. °You can remove outer dressing tomorrow. °Leave steri-strips (little pieces of tape) on until seen in the office for wound check appointment. °Call the office ([phone]) for redness, drainage, swelling, or fever. ° °

## 2018-03-11 NOTE — Progress Notes (Addendum)
PR    QRS  QT/QTc  396/421  No ectopy noted.  50 Minutes

## 2018-03-11 NOTE — Progress Notes (Signed)
Pt tolerating infusion well.  First EKG will be obtained at 1146.

## 2018-03-11 NOTE — H&P (Signed)
No changes since pt seen last week See previous note

## 2018-03-11 NOTE — Progress Notes (Signed)
PR  184ms QRS   100ms QT/QTc  394/43809ms  No ectopy noted.  80 minutes

## 2018-03-11 NOTE — Progress Notes (Signed)
PR    184ms QRS   88ms QT/QTc  402/41214ms  No ectopy noted.  90 minutes.

## 2018-03-14 ENCOUNTER — Encounter (HOSPITAL_COMMUNITY): Payer: Self-pay | Admitting: Internal Medicine

## 2018-03-21 ENCOUNTER — Ambulatory Visit (INDEPENDENT_AMBULATORY_CARE_PROVIDER_SITE_OTHER): Payer: Self-pay | Admitting: *Deleted

## 2018-03-21 DIAGNOSIS — R55 Syncope and collapse: Secondary | ICD-10-CM

## 2018-03-21 LAB — CUP PACEART INCLINIC DEVICE CHECK
Date Time Interrogation Session: 20191202152814
MDC IDC PG IMPLANT DT: 20191122

## 2018-03-21 NOTE — Progress Notes (Signed)
Wound check in clinic s/p ILR implant. Wound well healed without redness or edema. Incision edges approximated. Normal ILR device function. Battery status: GOOD. R-waves 1.3315mV. (1) symptom episodes - test per patient, no sx's, 0 tachy episodes, 0 pause episodes, 0 brady episodes. 0 AF episodes (0% burden). Patient education completed including wound care, remote monitoring, and billing. Monthly summary reports and ROV with SK in 3 months.

## 2018-04-07 ENCOUNTER — Telehealth: Payer: Self-pay

## 2018-04-07 NOTE — Telephone Encounter (Signed)
Confirmed remote transmission w/ pt sister.   

## 2018-04-14 ENCOUNTER — Ambulatory Visit (INDEPENDENT_AMBULATORY_CARE_PROVIDER_SITE_OTHER): Payer: Self-pay

## 2018-04-14 DIAGNOSIS — R55 Syncope and collapse: Secondary | ICD-10-CM

## 2018-04-14 LAB — CUP PACEART REMOTE DEVICE CHECK
Date Time Interrogation Session: 20191225184144
MDC IDC PG IMPLANT DT: 20191122

## 2018-04-14 NOTE — Progress Notes (Signed)
Carelink Summary Report / Loop Recorder 

## 2018-05-16 ENCOUNTER — Ambulatory Visit (INDEPENDENT_AMBULATORY_CARE_PROVIDER_SITE_OTHER): Payer: Self-pay

## 2018-05-16 DIAGNOSIS — R55 Syncope and collapse: Secondary | ICD-10-CM

## 2018-05-17 LAB — CUP PACEART REMOTE DEVICE CHECK
Implantable Pulse Generator Implant Date: 20191122
MDC IDC SESS DTM: 20200127183746

## 2018-05-17 NOTE — Progress Notes (Signed)
Carelink Summary Report / Loop Recorder 

## 2018-06-02 ENCOUNTER — Encounter: Payer: Self-pay | Admitting: Internal Medicine

## 2018-06-18 LAB — CUP PACEART REMOTE DEVICE CHECK
Implantable Pulse Generator Implant Date: 20191122
MDC IDC SESS DTM: 20200229183553

## 2018-06-20 ENCOUNTER — Ambulatory Visit (INDEPENDENT_AMBULATORY_CARE_PROVIDER_SITE_OTHER): Payer: Self-pay | Admitting: *Deleted

## 2018-06-20 ENCOUNTER — Ambulatory Visit (INDEPENDENT_AMBULATORY_CARE_PROVIDER_SITE_OTHER): Payer: Self-pay | Admitting: Internal Medicine

## 2018-06-20 ENCOUNTER — Encounter: Payer: Self-pay | Admitting: Internal Medicine

## 2018-06-20 VITALS — BP 120/78 | HR 84 | Ht 66.5 in | Wt 183.4 lb

## 2018-06-20 DIAGNOSIS — I498 Other specified cardiac arrhythmias: Secondary | ICD-10-CM

## 2018-06-20 DIAGNOSIS — R55 Syncope and collapse: Secondary | ICD-10-CM

## 2018-06-20 LAB — CUP PACEART INCLINIC DEVICE CHECK
Implantable Pulse Generator Implant Date: 20191122
MDC IDC SESS DTM: 20200302130435

## 2018-06-20 NOTE — Progress Notes (Signed)
      Patient Care Team: Patient, No Pcp Per as PCP - General (General Practice)   HPI  [name] [name] is a 38 y.o. male Seen in followup for syncope with Brugada type 2 ECG  After discussions with Dr. Genevie Ann and Priori (see note 03/07/2018) it was elected to proceed with a loop recorder implantation for evaluation of possible arrhythmic syncope  No interval syncope  Records and Results Reviewed  Past Medical History:  Diagnosis Date  . Abnormal EKG     Past Surgical History:  Procedure Laterality Date  . ELECTROPHYSIOLOGY STUDY N/A 03/11/2018   Procedure: ELECTROPHYSIOLOGY STUDY;  Surgeon: Duke Salvia, MD;  Location: [facility] INVASIVE CV LAB;  Service: Cardiovascular;  Laterality: N/A;  . LOOP RECORDER INSERTION N/A 03/11/2018   Procedure: LOOP RECORDER INSERTION;  Surgeon: Duke Salvia, MD;  Location: [facility] INVASIVE CV LAB;  Service: Cardiovascular;  Laterality: N/A;    No outpatient medications have been marked as taking for the 06/20/18 encounter (Office Visit) with Duke Salvia, MD.    Allergies  Allergen Reactions  . Bee Venom Anaphylaxis      Review of Systems negative except from HPI and PMH  Physical Exam BP 120/78   Pulse 84   Ht 5' 6.5" (1.689 m)   Wt 183 lb 6.4 oz (83.2 kg)   SpO2 98%   BMI 29.16 kg/m  Well developed and nourished in no acute distress HENT normal Neck supple with JVP-flat Clear Regular rate and rhythm, no murmurs or gallops Abd-soft with active BS No Clubbing cyanosis edema Skin-warm and dry A & Oriented  Grossly normal sensory and motor function   ECG demonstrates sinus rhythm at 84 Intervals 15/09/35 Minor ST segment elevation in V1 and V2  Assessment and  Plan Brugada type II ECG  Syncopal episode abrupt onset   No interval syncope.  We will plan to see him pretty much as needed unless he has a interval syncopal event.  We will continue to monitor him remotely looking for evidence of polymorphic ventricular  tachycardia that might occur particularly at night     Current medicines are reviewed at length with the patient today .  The patient does not  have concerns regarding medicines.

## 2018-06-20 NOTE — Patient Instructions (Signed)
Medication Instructions:  Your physician recommends that you continue on your current medications as directed. Please refer to the Current Medication list given to you today.  Labwork: None ordered.  Testing/Procedures: None ordered.  Follow-Up: Your physician recommends that you schedule a follow-up appointment in:   18 months with Dr Graciela Husbands  Any Other Special Instructions Will Be Listed Below (If Applicable).     If you need a refill on your cardiac medications before your next appointment, please call your pharmacy.

## 2018-06-27 NOTE — Progress Notes (Signed)
Carelink Summary Report / Loop Recorder 

## 2018-07-21 ENCOUNTER — Other Ambulatory Visit: Payer: Self-pay

## 2018-07-21 ENCOUNTER — Ambulatory Visit (INDEPENDENT_AMBULATORY_CARE_PROVIDER_SITE_OTHER): Payer: Self-pay | Admitting: *Deleted

## 2018-07-21 DIAGNOSIS — R55 Syncope and collapse: Secondary | ICD-10-CM

## 2018-07-21 LAB — CUP PACEART REMOTE DEVICE CHECK
Date Time Interrogation Session: 20200402194159
Implantable Pulse Generator Implant Date: 20191122

## 2018-07-29 NOTE — Progress Notes (Signed)
Carelink Summary Report / Loop Recorder 

## 2018-08-23 ENCOUNTER — Other Ambulatory Visit: Payer: Self-pay

## 2018-08-23 ENCOUNTER — Ambulatory Visit (INDEPENDENT_AMBULATORY_CARE_PROVIDER_SITE_OTHER): Payer: Self-pay | Admitting: *Deleted

## 2018-08-23 DIAGNOSIS — R55 Syncope and collapse: Secondary | ICD-10-CM

## 2018-08-24 LAB — CUP PACEART REMOTE DEVICE CHECK
Date Time Interrogation Session: 20200505194121
Implantable Pulse Generator Implant Date: 20191122

## 2018-08-30 NOTE — Progress Notes (Signed)
Carelink Summary Report / Loop Recorder 

## 2018-09-26 ENCOUNTER — Ambulatory Visit (INDEPENDENT_AMBULATORY_CARE_PROVIDER_SITE_OTHER): Payer: Self-pay | Admitting: *Deleted

## 2018-09-26 DIAGNOSIS — R55 Syncope and collapse: Secondary | ICD-10-CM

## 2018-09-26 LAB — CUP PACEART REMOTE DEVICE CHECK
Date Time Interrogation Session: 20200607200725
Implantable Pulse Generator Implant Date: 20191122

## 2018-10-04 NOTE — Progress Notes (Signed)
Carelink Summary Report / Loop Recorder 

## 2018-10-14 ENCOUNTER — Telehealth: Payer: Self-pay

## 2018-10-14 NOTE — Telephone Encounter (Signed)
Left message for patient to remind of missed remote transmission.  

## 2018-10-28 ENCOUNTER — Ambulatory Visit (INDEPENDENT_AMBULATORY_CARE_PROVIDER_SITE_OTHER): Payer: Self-pay | Admitting: *Deleted

## 2018-10-28 DIAGNOSIS — R55 Syncope and collapse: Secondary | ICD-10-CM

## 2018-10-28 LAB — CUP PACEART REMOTE DEVICE CHECK
Date Time Interrogation Session: 20200710201041
Implantable Pulse Generator Implant Date: 20191122

## 2018-11-01 NOTE — Progress Notes (Signed)
Carelink Summary Report / Loop Recorder 

## 2018-11-17 ENCOUNTER — Telehealth: Payer: Self-pay

## 2018-11-17 NOTE — Telephone Encounter (Signed)
Spoke with patient to advise of disconnected monitor. 

## 2018-11-30 ENCOUNTER — Ambulatory Visit (INDEPENDENT_AMBULATORY_CARE_PROVIDER_SITE_OTHER): Payer: Self-pay | Admitting: *Deleted

## 2018-11-30 DIAGNOSIS — R55 Syncope and collapse: Secondary | ICD-10-CM

## 2018-12-01 LAB — CUP PACEART REMOTE DEVICE CHECK
Date Time Interrogation Session: 20200812214032
Implantable Pulse Generator Implant Date: 20191122

## 2018-12-09 NOTE — Progress Notes (Signed)
Carelink Summary Report / Loop Recorder 

## 2019-01-02 ENCOUNTER — Ambulatory Visit (INDEPENDENT_AMBULATORY_CARE_PROVIDER_SITE_OTHER): Payer: Self-pay | Admitting: *Deleted

## 2019-01-02 DIAGNOSIS — R55 Syncope and collapse: Secondary | ICD-10-CM

## 2019-01-03 LAB — CUP PACEART REMOTE DEVICE CHECK
Date Time Interrogation Session: 20200914214027
Implantable Pulse Generator Implant Date: 20191122

## 2019-01-13 NOTE — Progress Notes (Signed)
Carelink Summary Report / Loop Recorder 

## 2019-02-06 ENCOUNTER — Ambulatory Visit (INDEPENDENT_AMBULATORY_CARE_PROVIDER_SITE_OTHER): Payer: Self-pay | Admitting: *Deleted

## 2019-02-06 DIAGNOSIS — R55 Syncope and collapse: Secondary | ICD-10-CM

## 2019-02-06 LAB — CUP PACEART REMOTE DEVICE CHECK
Date Time Interrogation Session: 20201017213927
Implantable Pulse Generator Implant Date: 20191122

## 2019-02-27 NOTE — Progress Notes (Signed)
Carelink Summary Report / Loop Recorder 

## 2019-03-09 ENCOUNTER — Ambulatory Visit (INDEPENDENT_AMBULATORY_CARE_PROVIDER_SITE_OTHER): Payer: Self-pay | Admitting: *Deleted

## 2019-03-09 DIAGNOSIS — R55 Syncope and collapse: Secondary | ICD-10-CM

## 2019-03-09 LAB — CUP PACEART REMOTE DEVICE CHECK
Date Time Interrogation Session: 20201119164134
Implantable Pulse Generator Implant Date: 20191122

## 2019-04-07 NOTE — Progress Notes (Signed)
Carelink Summary Report / Loop Recorder 

## 2019-04-11 ENCOUNTER — Ambulatory Visit (INDEPENDENT_AMBULATORY_CARE_PROVIDER_SITE_OTHER): Payer: Self-pay | Admitting: *Deleted

## 2019-04-11 DIAGNOSIS — R55 Syncope and collapse: Secondary | ICD-10-CM

## 2019-04-12 LAB — CUP PACEART REMOTE DEVICE CHECK
Date Time Interrogation Session: 20201222164402
Implantable Pulse Generator Implant Date: 20191122

## 2019-05-15 ENCOUNTER — Ambulatory Visit (INDEPENDENT_AMBULATORY_CARE_PROVIDER_SITE_OTHER): Payer: Self-pay | Admitting: *Deleted

## 2019-05-15 DIAGNOSIS — R55 Syncope and collapse: Secondary | ICD-10-CM

## 2019-05-15 LAB — CUP PACEART REMOTE DEVICE CHECK
Date Time Interrogation Session: 20210124232814
Implantable Pulse Generator Implant Date: 20191122

## 2019-06-19 ENCOUNTER — Ambulatory Visit (INDEPENDENT_AMBULATORY_CARE_PROVIDER_SITE_OTHER): Payer: Self-pay | Admitting: *Deleted

## 2019-06-19 DIAGNOSIS — R55 Syncope and collapse: Secondary | ICD-10-CM

## 2019-06-19 LAB — CUP PACEART REMOTE DEVICE CHECK
Date Time Interrogation Session: 20210228235703
Implantable Pulse Generator Implant Date: 20191122

## 2019-06-20 NOTE — Progress Notes (Signed)
ILR Remote 

## 2019-07-24 ENCOUNTER — Ambulatory Visit (INDEPENDENT_AMBULATORY_CARE_PROVIDER_SITE_OTHER): Payer: Self-pay | Admitting: *Deleted

## 2019-07-24 DIAGNOSIS — R55 Syncope and collapse: Secondary | ICD-10-CM

## 2019-07-25 NOTE — Progress Notes (Signed)
ILR Remote 

## 2019-08-24 LAB — CUP PACEART REMOTE DEVICE CHECK
Date Time Interrogation Session: 20210502011011
Implantable Pulse Generator Implant Date: 20191122

## 2019-08-28 ENCOUNTER — Ambulatory Visit (INDEPENDENT_AMBULATORY_CARE_PROVIDER_SITE_OTHER): Payer: Self-pay | Admitting: *Deleted

## 2019-08-28 DIAGNOSIS — R55 Syncope and collapse: Secondary | ICD-10-CM

## 2019-08-29 NOTE — Progress Notes (Signed)
Carelink Summary Report / Loop Recorder 

## 2019-10-02 ENCOUNTER — Ambulatory Visit (INDEPENDENT_AMBULATORY_CARE_PROVIDER_SITE_OTHER): Payer: Self-pay | Admitting: *Deleted

## 2019-10-02 DIAGNOSIS — R55 Syncope and collapse: Secondary | ICD-10-CM

## 2019-10-02 LAB — CUP PACEART REMOTE DEVICE CHECK
Date Time Interrogation Session: 20210613235843
Implantable Pulse Generator Implant Date: 20191122

## 2019-10-04 NOTE — Progress Notes (Signed)
Carelink Summary Report / Loop Recorder 

## 2019-11-06 ENCOUNTER — Ambulatory Visit (INDEPENDENT_AMBULATORY_CARE_PROVIDER_SITE_OTHER): Payer: Self-pay | Admitting: *Deleted

## 2019-11-06 DIAGNOSIS — R55 Syncope and collapse: Secondary | ICD-10-CM

## 2019-11-06 LAB — CUP PACEART REMOTE DEVICE CHECK
Date Time Interrogation Session: 20210718232330
Implantable Pulse Generator Implant Date: 20191122

## 2019-11-08 NOTE — Progress Notes (Signed)
Carelink Summary Report / Loop Recorder 

## 2019-12-10 LAB — CUP PACEART REMOTE DEVICE CHECK
Date Time Interrogation Session: 20210819001419
Implantable Pulse Generator Implant Date: 20191122

## 2019-12-11 ENCOUNTER — Ambulatory Visit (INDEPENDENT_AMBULATORY_CARE_PROVIDER_SITE_OTHER): Payer: Self-pay | Admitting: *Deleted

## 2019-12-11 DIAGNOSIS — R55 Syncope and collapse: Secondary | ICD-10-CM

## 2019-12-15 NOTE — Progress Notes (Signed)
Carelink Summary Report / Loop Recorder 

## 2020-01-14 LAB — CUP PACEART REMOTE DEVICE CHECK
Date Time Interrogation Session: 20210919003018
Implantable Pulse Generator Implant Date: 20191122

## 2020-01-15 ENCOUNTER — Ambulatory Visit (INDEPENDENT_AMBULATORY_CARE_PROVIDER_SITE_OTHER): Payer: Self-pay | Admitting: Emergency Medicine

## 2020-01-15 DIAGNOSIS — R55 Syncope and collapse: Secondary | ICD-10-CM

## 2020-01-17 NOTE — Progress Notes (Signed)
Carelink Summary Report / Loop Recorder 

## 2020-01-29 ENCOUNTER — Telehealth: Payer: Self-pay

## 2020-01-29 NOTE — Telephone Encounter (Signed)
Carelink alert received 01/27/20 for 1 pause event. No details available. Called patient to request a manual send.  Patient advised of event and that we need a manual transmission. States he is at work but he will send one when he gets home. Advised patient if he needs help to please call the DC. Verbalized understanding.   Patient advised he is overdue and a scheduler will reach out to him.

## 2020-01-30 NOTE — Telephone Encounter (Signed)
LMOVM for pt to send manual transmission. I gave the pt my direct office number with the pt.

## 2020-02-02 NOTE — Telephone Encounter (Signed)
Following up with patient about manual transmission.   No answer, LMOVM.

## 2020-02-07 ENCOUNTER — Ambulatory Visit (INDEPENDENT_AMBULATORY_CARE_PROVIDER_SITE_OTHER): Payer: Self-pay

## 2020-02-07 DIAGNOSIS — R55 Syncope and collapse: Secondary | ICD-10-CM

## 2020-02-10 LAB — CUP PACEART REMOTE DEVICE CHECK
Date Time Interrogation Session: 20211020022259
Implantable Pulse Generator Implant Date: 20191122

## 2020-02-13 NOTE — Progress Notes (Signed)
Carelink Summary Report / Loop Recorder 

## 2020-02-14 NOTE — Telephone Encounter (Signed)
Patient reports he receives error code when he attempts to send remote transmission. He is at work and will not be home until after 4 pm. Patient provided contact # for [employer] support and device clinic # to let our office know what results of call about monitor are.

## 2020-02-15 NOTE — Telephone Encounter (Signed)
Patient states he called [employer] and stayed on the phone for over an hour. States he will call them back today after work. Advised patient to call device back if he needs assistance and to give [place] an update. Agreeable to plan.

## 2020-02-19 NOTE — Telephone Encounter (Signed)
LMOVM

## 2020-02-23 NOTE — Telephone Encounter (Signed)
Letter sent 02/23/2020

## 2020-03-11 ENCOUNTER — Ambulatory Visit (INDEPENDENT_AMBULATORY_CARE_PROVIDER_SITE_OTHER): Payer: Self-pay

## 2020-03-11 DIAGNOSIS — R55 Syncope and collapse: Secondary | ICD-10-CM

## 2020-03-11 LAB — CUP PACEART REMOTE DEVICE CHECK
Date Time Interrogation Session: 20211120013345
Implantable Pulse Generator Implant Date: 20191122

## 2020-03-13 NOTE — Progress Notes (Signed)
Carelink Summary Report / Loop Recorder 

## 2020-03-17 DIAGNOSIS — R55 Syncope and collapse: Secondary | ICD-10-CM | POA: Insufficient documentation

## 2020-03-17 DIAGNOSIS — Z9889 Other specified postprocedural states: Secondary | ICD-10-CM | POA: Insufficient documentation

## 2020-03-17 DIAGNOSIS — I498 Other specified cardiac arrhythmias: Secondary | ICD-10-CM | POA: Insufficient documentation

## 2020-03-19 ENCOUNTER — Other Ambulatory Visit: Payer: Self-pay

## 2020-03-19 ENCOUNTER — Encounter: Payer: Self-pay | Admitting: Internal Medicine

## 2020-04-09 LAB — CUP PACEART REMOTE DEVICE CHECK
Date Time Interrogation Session: 20211221013732
Implantable Pulse Generator Implant Date: 20191122

## 2020-04-15 ENCOUNTER — Ambulatory Visit (INDEPENDENT_AMBULATORY_CARE_PROVIDER_SITE_OTHER): Payer: Self-pay

## 2020-04-15 DIAGNOSIS — R55 Syncope and collapse: Secondary | ICD-10-CM

## 2020-04-18 ENCOUNTER — Encounter: Payer: Self-pay | Admitting: Physician Assistant

## 2020-04-18 NOTE — Progress Notes (Deleted)
   Cardiology Office Note Date:  04/18/2020  Patient ID:  [name] [name] [birth year], MRN [id number] PCP:  Patient, No Pcp Per  Cardiologist/Electrophysiologist: Dr. Graciela Husbands  ***refresh   Chief Complaint: *** annual visit  History of Present Illness: [name] [name] is a 39 y.o. male with history of Brugada type 2 EKG, syncope > loop implant.  He comes in today to be seen for Dr. Graciela Husbands, last seen by him March 2020, was doing well, had not had recurrent syncope. No loop finsings and planned to see PRN if any recurrent syncope, r loop findings.  *** symptoms? *** labs, lipids, etc *** family hx   Device information MDT ILR implanted 03/11/2018, syncope   Past Medical History:  Diagnosis Date  . Abnormal EKG     Past Surgical History:  Procedure Laterality Date  . ELECTROPHYSIOLOGY STUDY N/A 03/11/2018   Procedure: ELECTROPHYSIOLOGY STUDY;  Surgeon: Duke Salvia, MD;  Location: [facility] INVASIVE CV LAB;  Service: Cardiovascular;  Laterality: N/A;  . LOOP RECORDER INSERTION N/A 03/11/2018   Procedure: LOOP RECORDER INSERTION;  Surgeon: Duke Salvia, MD;  Location: [facility] INVASIVE CV LAB;  Service: Cardiovascular;  Laterality: N/A;    No current outpatient medications on file.   No current facility-administered medications for this visit.    Allergies:   Bee venom   Social History:  The patient  reports that he has been smoking cigarettes. He has been smoking about 0.25 packs per day. He has never used smokeless tobacco. He reports current alcohol use. He reports current drug use. Drug: Marijuana.   Family History:  The patient's family history is not on file.***  ROS:  Please see the history of present illness.    All other systems are reviewed and otherwise negative.   PHYSICAL EXAM:  VS:  There were no vitals taken for this visit. BMI: There is no height or weight on file to calculate BMI. Well nourished, well developed, in no acute distress HEENT:  normocephalic, atraumatic Neck: no JVD, carotid bruits or masses Cardiac:  *** RRR; no significant murmurs, no rubs, or gallops Lungs:  *** CTA b/l, no wheezing, rhonchi or rales Abd: soft, nontender MS: no deformity or *** atrophy Ext: *** no edema Skin: warm and dry, no rash Neuro:  No gross deficits appreciated Psych: euthymic mood, full affect  *** ILR site is stable, no tethering or discomfort   EKG:  Done today and reviewed by myself shows  ***  Device interrogation done today and reviewed by myself:  ***  Recent Labs: No results found for requested labs within last 8760 hours.  No results found for requested labs within last 8760 hours.   CrCl cannot be calculated (Patient's most recent lab result is older than the maximum 21 days allowed.).   Wt Readings from Last 3 Encounters:  06/20/18 183 lb 6.4 oz (83.2 kg)  03/11/18 180 lb (81.6 kg)  03/04/18 178 lb 12.8 oz (81.1 kg)     Other studies reviewed: Additional studies/records reviewed today include: summarized above  ASSESSMENT AND PLAN:  1. Brugada type II EKG 2. Syncope     ***  Disposition: F/u with ***  Current medicines are reviewed at length with the patient today.  The patient did not have any concerns regarding medicines.  Norma Fredrickson, PA-C 04/18/2020 5:55 AM     [facility] [address] [place] [address] [phone] (office)  [phone] (fax)

## 2020-04-29 NOTE — Progress Notes (Signed)
Carelink Summary Report / Loop Recorder 

## 2020-05-20 ENCOUNTER — Ambulatory Visit (INDEPENDENT_AMBULATORY_CARE_PROVIDER_SITE_OTHER): Payer: Self-pay

## 2020-05-20 DIAGNOSIS — R55 Syncope and collapse: Secondary | ICD-10-CM

## 2020-05-20 LAB — CUP PACEART REMOTE DEVICE CHECK
Date Time Interrogation Session: 20220129231302
Implantable Pulse Generator Implant Date: 20191122

## 2020-05-29 NOTE — Progress Notes (Signed)
Carelink Summary Report / Loop Recorder 

## 2020-06-24 ENCOUNTER — Ambulatory Visit (INDEPENDENT_AMBULATORY_CARE_PROVIDER_SITE_OTHER): Payer: Self-pay

## 2020-06-24 DIAGNOSIS — R55 Syncope and collapse: Secondary | ICD-10-CM

## 2020-06-25 ENCOUNTER — Encounter: Payer: Self-pay | Admitting: Internal Medicine

## 2020-06-26 LAB — CUP PACEART REMOTE DEVICE CHECK
Date Time Interrogation Session: 20220301231804
Implantable Pulse Generator Implant Date: 20191122

## 2020-07-03 NOTE — Progress Notes (Signed)
Carelink Summary Report / Loop Recorder 

## 2020-07-27 LAB — CUP PACEART REMOTE DEVICE CHECK
Date Time Interrogation Session: 20220402001914
Implantable Pulse Generator Implant Date: 20191122

## 2020-07-29 ENCOUNTER — Ambulatory Visit (INDEPENDENT_AMBULATORY_CARE_PROVIDER_SITE_OTHER): Payer: Self-pay

## 2020-07-29 DIAGNOSIS — R55 Syncope and collapse: Secondary | ICD-10-CM

## 2020-08-13 NOTE — Progress Notes (Signed)
Carelink Summary Report / Loop Recorder 

## 2020-09-02 ENCOUNTER — Ambulatory Visit (INDEPENDENT_AMBULATORY_CARE_PROVIDER_SITE_OTHER): Payer: Self-pay

## 2020-09-02 DIAGNOSIS — R55 Syncope and collapse: Secondary | ICD-10-CM

## 2020-09-03 LAB — CUP PACEART REMOTE DEVICE CHECK
Date Time Interrogation Session: 20220514231213
Implantable Pulse Generator Implant Date: 20191122

## 2020-09-04 ENCOUNTER — Ambulatory Visit
Admission: EM | Admit: 2020-09-04 | Discharge: 2020-09-04 | Disposition: A | Discharge status: Home / Self Care | Payer: Self-pay | Attending: Emergency Medicine | Admitting: Emergency Medicine

## 2020-09-04 ENCOUNTER — Ambulatory Visit (INDEPENDENT_AMBULATORY_CARE_PROVIDER_SITE_OTHER): Payer: Self-pay

## 2020-09-04 ENCOUNTER — Other Ambulatory Visit: Payer: Self-pay

## 2020-09-04 ENCOUNTER — Encounter: Payer: Self-pay | Admitting: Emergency Medicine

## 2020-09-04 DIAGNOSIS — M25572 Pain in left ankle and joints of left foot: Secondary | ICD-10-CM

## 2020-09-04 DIAGNOSIS — S93402A Sprain of unspecified ligament of left ankle, initial encounter: Secondary | ICD-10-CM

## 2020-09-04 MED ORDER — IBUPROFEN 800 MG PO TABS: 800.0000 mg | ORAL_TABLET | Freq: Three times a day (TID) | ORAL | 0 refills | Status: AC

## 2020-09-04 NOTE — ED Triage Notes (Signed)
Patient states that he injured his left ankle while at work on Monday.  Patient is having swelling and pain in left ankle.  Applied ice, denies any Tylenol.

## 2020-09-04 NOTE — ED Triage Notes (Deleted)
Patient was playing basketball on Monday and injured his left ankle.  Patient has swelling in ankle.  Patient has applied ice without relief.  Unable to bare weight.

## 2020-09-04 NOTE — ED Provider Notes (Signed)
[facility]    CSN: [id number] Arrival date & time: 09/04/20  1125      History   Chief Complaint Chief Complaint  Patient presents with  . Ankle Pain    HPI [name] [name] is a 40 y.o. male presenting today for evaluation of left ankle injury.  Reports that he was stepped down from a ladder rolled his left ankle and since has had pain with weightbearing as well as swelling.  Denies prior injury or fracture to this ankle.  Applying ice.  HPI  Past Medical History:  Diagnosis Date  . Abnormal EKG     Patient Active Problem List   Diagnosis Date Noted  . Syncope 03/17/2020  . Brugada syndrome 03/17/2020  . History of loop recorder 03/17/2020    Past Surgical History:  Procedure Laterality Date  . ELECTROPHYSIOLOGY STUDY N/A 03/11/2018   Procedure: ELECTROPHYSIOLOGY STUDY;  Surgeon: Duke Salvia, MD;  Location: [facility] INVASIVE CV LAB;  Service: Cardiovascular;  Laterality: N/A;  . LOOP RECORDER INSERTION N/A 03/11/2018   Procedure: LOOP RECORDER INSERTION;  Surgeon: Duke Salvia, MD;  Location: [facility] INVASIVE CV LAB;  Service: Cardiovascular;  Laterality: N/A;       Home Medications    Prior to Admission medications   Medication Sig Start Date End Date Taking? Authorizing Provider  ibuprofen (ADVIL) 800 MG tablet Take 1 tablet (800 mg total) by mouth 3 (three) times daily. 09/04/20  Yes Jomari Bartnik, [place] C, PA-C    Family History No family history on file.  Social History Social History   Tobacco Use  . Smoking status: Current Every Day Smoker    Packs/day: 0.25    Types: Cigarettes  . Smokeless tobacco: Never Used  Vaping Use  . Vaping Use: Never used  Substance Use Topics  . Alcohol use: Yes    Comment: "Every now and then"   . Drug use: Yes    Types: Marijuana     Allergies   Bee venom   Review of Systems Review of Systems  Constitutional: Negative for fatigue and fever.  Eyes: Negative for redness, itching and visual  disturbance.  Respiratory: Negative for shortness of breath.   Cardiovascular: Negative for chest pain and leg swelling.  Gastrointestinal: Negative for nausea and vomiting.  Musculoskeletal: Positive for arthralgias, gait problem and joint swelling. Negative for myalgias.  Skin: Negative for color change, rash and wound.  Neurological: Negative for dizziness, syncope, weakness, light-headedness and headaches.     Physical Exam Triage Vital Signs ED Triage Vitals  Enc Vitals Group     BP 09/04/20 1423 106/67     Pulse Rate 09/04/20 1423 63     Resp --      Temp --      Temp src --      SpO2 09/04/20 1423 98 %     Weight --      Height --      Head Circumference --      Peak Flow --      Pain Score 09/04/20 1425 6     Pain Loc --      Pain Edu? --      Excl. in GC? --    No data found.  Updated Vital Signs BP 106/67 (BP Location: Left Arm)   Pulse 63   SpO2 98%   Visual Acuity Right Eye Distance:   Left Eye Distance:   Bilateral Distance:    Right Eye Near:  Left Eye Near:    Bilateral Near:     Physical Exam Vitals and nursing note reviewed.  Constitutional:      Appearance: He is well-developed.     Comments: No acute distress  HENT:     Head: Normocephalic and atraumatic.     Nose: Nose normal.  Eyes:     Conjunctiva/sclera: Conjunctivae normal.  Cardiovascular:     Rate and Rhythm: Normal rate.  Pulmonary:     Effort: Pulmonary effort is normal. No respiratory distress.  Abdominal:     General: There is no distension.  Musculoskeletal:        General: Normal range of motion.     Cervical back: Neck supple.     Comments: Left ankle: Moderate swelling about lateral malleolus, tender to palpation in this area, nontender to anterior ankle, medial ankle or along Achilles, nontender throughout dorsum of foot, dorsalis pedis 2+  Skin:    General: Skin is warm and dry.  Neurological:     Mental Status: He is alert and oriented to person, place, and  time.      UC Treatments / Results  Labs (all labs ordered are listed, but only abnormal results are displayed) Labs Reviewed - No data to display  EKG   Radiology No results found.  Procedures Procedures (including critical care time)  Medications Ordered in UC Medications - No data to display  Initial Impression / Assessment and Plan / UC Course  I have reviewed the triage vital signs and the nursing notes.  Pertinent labs & imaging results that were available during my care of the patient were reviewed by me and considered in my medical decision making (see chart for details).     Left ankle sprain, no acute fracture noted on imaging today, placing on crutches for comfort and Ace wrap to help with swelling about ankle.  Ice elevate and anti-inflammatories.  Monitor for gradual resolution.  Discussed strict return precautions. Patient verbalized understanding and is agreeable with plan.  Final Clinical Impressions(s) / UC Diagnoses   Final diagnoses:  Acute left ankle pain  Sprain of left ankle, unspecified ligament, initial encounter     Discharge Instructions     Xray normal No fracture Use anti-inflammatories for pain/swelling. You may take up to 800 mg Ibuprofen every 8 hours with food. You may supplement Ibuprofen with Tylenol [phone] mg every 8 hours.  Ice and elevate Follow up with not improving    ED Prescriptions    Medication Sig Dispense Auth. Provider   ibuprofen (ADVIL) 800 MG tablet Take 1 tablet (800 mg total) by mouth 3 (three) times daily. 21 tablet Ivette Castronova, [place] C, PA-C     PDMP not reviewed this encounter.   Lew Dawes, PA-C 09/04/20 1524

## 2020-09-04 NOTE — Discharge Instructions (Signed)
Xray normal No fracture Use anti-inflammatories for pain/swelling. You may take up to 800 mg Ibuprofen every 8 hours with food. You may supplement Ibuprofen with Tylenol [phone] mg every 8 hours.  Ice and elevate Follow up with not improving

## 2020-09-25 NOTE — Progress Notes (Signed)
Carelink Summary Report / Loop Recorder 

## 2020-10-07 ENCOUNTER — Ambulatory Visit (INDEPENDENT_AMBULATORY_CARE_PROVIDER_SITE_OTHER): Payer: Self-pay

## 2020-10-07 DIAGNOSIS — R55 Syncope and collapse: Secondary | ICD-10-CM

## 2020-10-07 LAB — CUP PACEART REMOTE DEVICE CHECK
Date Time Interrogation Session: 20220614232859
Implantable Pulse Generator Implant Date: 20191122

## 2020-10-24 ENCOUNTER — Encounter: Payer: Self-pay | Admitting: Internal Medicine

## 2020-10-24 ENCOUNTER — Other Ambulatory Visit: Payer: Self-pay

## 2020-10-24 ENCOUNTER — Ambulatory Visit (INDEPENDENT_AMBULATORY_CARE_PROVIDER_SITE_OTHER): Payer: Self-pay | Admitting: Internal Medicine

## 2020-10-24 VITALS — BP 96/64 | HR 65 | Ht 66.5 in | Wt 180.6 lb

## 2020-10-24 DIAGNOSIS — R55 Syncope and collapse: Secondary | ICD-10-CM

## 2020-10-24 DIAGNOSIS — I498 Other specified cardiac arrhythmias: Secondary | ICD-10-CM

## 2020-10-24 DIAGNOSIS — Z9889 Other specified postprocedural states: Secondary | ICD-10-CM

## 2020-10-24 NOTE — Progress Notes (Signed)
Going to [facility] to patient ID: [name] [name], male   DOB: 1980-08-13, 40 y.o.   MRN: [id number]      Patient Care Team: Patient, No Pcp Per (Inactive) as PCP - General (General Practice)   HPI  [name] [name] is a 40 y.o. male Seen in followup for syncope with Brugada type 2 ECG  After discussions with Dr. Genevie Ann and Priori (see note 03/07/2018) it was elected to proceed with a loop recorder implantation for evaluation of possible arrhythmic syncope  No interval syncope.   Today,the patient denies chest pain, shortness of breath, nocturnal dyspnea, orthopnea or peripheral edema. There have been no palpitations, lightheadedness or syncope.     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   Cr              K         Hgb   3/10 1.4 3.9         16.0   11/19  1.16 4.2            13.6     Past Medical History:  Diagnosis Date   Abnormal EKG     Past Surgical History:  Procedure Laterality Date   ELECTROPHYSIOLOGY STUDY N/A 03/11/2018   Procedure: ELECTROPHYSIOLOGY STUDY;  Surgeon: Duke Salvia, MD;  Location: [facility] INVASIVE CV LAB;  Service: Cardiovascular;  Laterality: N/A;   LOOP RECORDER INSERTION N/A 03/11/2018   Procedure: LOOP RECORDER INSERTION;  Surgeon: Duke Salvia, MD;  Location: [facility] INVASIVE CV LAB;  Service: Cardiovascular;  Laterality: N/A;    Current Meds  Medication Sig   ibuprofen (ADVIL) 800 MG tablet Take 1 tablet (800 mg total) by mouth 3 (three) times daily.    Allergies  Allergen Reactions   Bee Venom Anaphylaxis    Review of Systems negative except from HPI and PMH  Physical Exam: BP 96/64   Pulse 65   Ht 5' 6.5" (1.689 m)   Wt 180 lb 9.6 oz (81.9 kg)   SpO2 98%   BMI 28.71 kg/m  Well developed and well nourished in no acute distress HENT normal Neck supple with JVP-flat Lungs Clear Device pocket well healed; without hematoma or  erythema.  There is no tethering  Regular rate and rhythm, No gallop No  murmur Abd-soft with active BS No Clubbing cyanosis No edema Skin-warm and dry A & Oriented  Grossly normal sensory and motor function  ECG: sinus @ 65 Mild ST elevation 1, L V1-2    Assessment and  Plan:  Brugada type II ECG   Syncopal episode abrupt onset No interval syncope.  Continue to monitor  I,Stephanie Williams,acting as a scribe for Sherryl Manges, MD.,have documented all relevant documentation on the behalf of Sherryl Manges, MD,as directed by  Sherryl Manges, MD while in the presence of Sherryl Manges, MD.  I, Sherryl Manges, MD, have reviewed all documentation for this visit. The documentation on 10/24/20 for the exam, diagnosis, procedures, and orders are all accurate and complete.

## 2020-10-24 NOTE — Patient Instructions (Signed)

## 2020-10-28 NOTE — Progress Notes (Signed)
Carelink Summary Report / Loop Recorder 

## 2020-11-07 LAB — CUP PACEART REMOTE DEVICE CHECK
Date Time Interrogation Session: 20220716002643
Implantable Pulse Generator Implant Date: 20191122

## 2020-11-11 ENCOUNTER — Ambulatory Visit (INDEPENDENT_AMBULATORY_CARE_PROVIDER_SITE_OTHER): Payer: Self-pay

## 2020-11-11 DIAGNOSIS — R55 Syncope and collapse: Secondary | ICD-10-CM

## 2020-12-03 ENCOUNTER — Ambulatory Visit (INDEPENDENT_AMBULATORY_CARE_PROVIDER_SITE_OTHER): Payer: Self-pay

## 2020-12-03 DIAGNOSIS — R55 Syncope and collapse: Secondary | ICD-10-CM

## 2020-12-03 LAB — CUP PACEART REMOTE DEVICE CHECK
Date Time Interrogation Session: 20220816005739
Implantable Pulse Generator Implant Date: 20191122

## 2020-12-06 NOTE — Progress Notes (Signed)
Carelink Summary Report / Loop Recorder 

## 2020-12-24 NOTE — Progress Notes (Signed)
Carelink Summary Report / Loop Recorder 

## 2021-01-06 ENCOUNTER — Ambulatory Visit (INDEPENDENT_AMBULATORY_CARE_PROVIDER_SITE_OTHER): Payer: Self-pay

## 2021-01-06 DIAGNOSIS — R55 Syncope and collapse: Secondary | ICD-10-CM

## 2021-01-07 LAB — CUP PACEART REMOTE DEVICE CHECK
Date Time Interrogation Session: 20220916011258
Implantable Pulse Generator Implant Date: 20191122

## 2021-01-13 NOTE — Progress Notes (Signed)
Carelink Summary Report / Loop Recorder 

## 2021-02-10 ENCOUNTER — Ambulatory Visit (INDEPENDENT_AMBULATORY_CARE_PROVIDER_SITE_OTHER): Payer: Self-pay

## 2021-02-10 DIAGNOSIS — R55 Syncope and collapse: Secondary | ICD-10-CM

## 2021-02-10 LAB — CUP PACEART REMOTE DEVICE CHECK
Date Time Interrogation Session: 20221017011632
Implantable Pulse Generator Implant Date: 20191122

## 2021-02-12 ENCOUNTER — Telehealth: Payer: Self-pay

## 2021-02-12 NOTE — Telephone Encounter (Signed)
Linq alert, AF episode with no details available.  Pt implanted for syncope, no documented history of AF.  LOV 10/24/20 with Dr. Graciela Husbands.    Attempted to reach pt to have him send a manual transmission.  Line is busy x3.

## 2021-02-13 NOTE — Telephone Encounter (Signed)
Second unsuccessful attempt to reach patient to request manual linq transmission. Patients give phone number was busy x 3 attempts. Listed girlfriend Cheri Rous is on DPR however voicemail message listed another name. No message left. Will attempt to reach patient at later date and if unsuccessful will send certified letter to address on file.

## 2021-02-18 NOTE — Progress Notes (Signed)
Carelink Summary Report / Loop Recorder 

## 2021-02-21 NOTE — Telephone Encounter (Signed)
Remote transmission received today. No episodes documented however still indicates 1 AF episode. Algorithm may have determined episode to be false. Will continue to monitor.

## 2021-03-15 LAB — CUP PACEART REMOTE DEVICE CHECK
Date Time Interrogation Session: 20221117001844
Implantable Pulse Generator Implant Date: 20191122

## 2021-03-17 ENCOUNTER — Ambulatory Visit (INDEPENDENT_AMBULATORY_CARE_PROVIDER_SITE_OTHER): Payer: Self-pay

## 2021-03-17 DIAGNOSIS — R55 Syncope and collapse: Secondary | ICD-10-CM

## 2021-03-26 NOTE — Progress Notes (Signed)
Carelink Summary Report / Loop Recorder 

## 2021-04-07 ENCOUNTER — Ambulatory Visit (INDEPENDENT_AMBULATORY_CARE_PROVIDER_SITE_OTHER): Payer: Self-pay

## 2021-04-07 DIAGNOSIS — R55 Syncope and collapse: Secondary | ICD-10-CM

## 2021-04-08 LAB — CUP PACEART REMOTE DEVICE CHECK
Date Time Interrogation Session: 20221218002431
Implantable Pulse Generator Implant Date: 20191122

## 2021-04-17 NOTE — Progress Notes (Signed)
Carelink Summary Report / Loop Recorder 

## 2021-05-12 ENCOUNTER — Ambulatory Visit (INDEPENDENT_AMBULATORY_CARE_PROVIDER_SITE_OTHER): Payer: Self-pay

## 2021-05-12 DIAGNOSIS — R55 Syncope and collapse: Secondary | ICD-10-CM

## 2021-05-12 LAB — CUP PACEART REMOTE DEVICE CHECK
Date Time Interrogation Session: 20230122234713
Implantable Pulse Generator Implant Date: 20191122

## 2021-05-23 NOTE — Progress Notes (Signed)
Carelink Summary Report / Loop Recorder 

## 2021-06-15 LAB — CUP PACEART REMOTE DEVICE CHECK
Date Time Interrogation Session: 20230224234735
Implantable Pulse Generator Implant Date: 20191122

## 2021-06-16 ENCOUNTER — Ambulatory Visit (INDEPENDENT_AMBULATORY_CARE_PROVIDER_SITE_OTHER): Payer: Self-pay

## 2021-06-16 DIAGNOSIS — R55 Syncope and collapse: Secondary | ICD-10-CM

## 2021-06-23 NOTE — Progress Notes (Signed)
Carelink Summary Report / Loop Recorder 

## 2021-07-04 ENCOUNTER — Encounter (HOSPITAL_COMMUNITY): Payer: Self-pay

## 2021-07-04 ENCOUNTER — Emergency Department (HOSPITAL_COMMUNITY)
Admission: EM | Admit: 2021-07-04 | Discharge: 2021-07-04 | Disposition: A | Discharge status: Home / Self Care | Payer: Self-pay | Attending: Emergency Medicine | Admitting: Emergency Medicine

## 2021-07-04 ENCOUNTER — Emergency Department (HOSPITAL_COMMUNITY): Payer: Self-pay

## 2021-07-04 DIAGNOSIS — Z95810 Presence of automatic (implantable) cardiac defibrillator: Secondary | ICD-10-CM | POA: Insufficient documentation

## 2021-07-04 DIAGNOSIS — R0789 Other chest pain: Secondary | ICD-10-CM | POA: Insufficient documentation

## 2021-07-04 HISTORY — DX: Other specified cardiac arrhythmias: I49.8

## 2021-07-04 NOTE — Discharge Instructions (Signed)
Follow-up with Dr. Graciela Husbands in the next few days if symptoms are not improving. ?

## 2021-07-04 NOTE — ED Provider Notes (Signed)
?[facility] ?Provider Note ? ? ?CSN: [id number] ?Arrival date & time: 07/04/21  [id number] ? ?  ? ?History ? ?Chief Complaint  ?Patient presents with  ? chest wall  ? ? ?[name] [name] is a 41 y.o. male. ? ?Patient is a 41 year old male with history of Brugada Syndrome presenting with complaints of irritation of the skin overlying his defibrillator in the left upper chest wall.  No chest pain, cough, fevers, or shocks from the defibrillator.  He feels as though the defibrillator itself is poking his skin.  The irritation improves when he lies on his side, but worsens when he sits forward or reaches forward with his arms. ? ?The history is provided by the patient.  ? ?  ? ?Home Medications ?Prior to Admission medications   ?Medication Sig Start Date End Date Taking? Authorizing Provider  ?acetaminophen (TYLENOL) 500 MG tablet Take 500 mg by mouth every 6 (six) hours as needed for moderate pain or headache.   Yes [provider]  ?ibuprofen (ADVIL) 800 MG tablet Take 1 tablet (800 mg total) by mouth 3 (three) times daily. ?Patient not taking: Reported on 07/04/2021 09/04/20   Janith Lima, PA-C  ?   ? ?Allergies    ?Bee venom   ? ?Review of Systems   ?Review of Systems  ?All other systems reviewed and are negative. ? ?Physical Exam ?Updated Vital Signs ?BP (!) 130/97 (BP Location: Right Arm)   Pulse (!) 109   Temp 98.2 ?F (36.8 ?C) (Oral)   Resp 18   Ht 5\' 6"  (1.676 m)   Wt 78.5 kg   SpO2 99%   BMI 27.92 kg/m?  ?Physical Exam ?Vitals and nursing note reviewed.  ?Constitutional:   ?   General: He is not in acute distress. ?   Appearance: He is well-developed. He is not diaphoretic.  ?HENT:  ?   Head: Normocephalic and atraumatic.  ?Cardiovascular:  ?   Rate and Rhythm: Normal rate and regular rhythm.  ?   Heart sounds: No murmur heard. ?  No friction rub.  ?   Comments: The defibrillator is palpable in the soft tissues of the left upper chest wall just lateral to  the sternum.  There is no tenting of the skin or tension on the skin. ?Pulmonary:  ?   Effort: Pulmonary effort is normal. No respiratory distress.  ?   Breath sounds: Normal breath sounds. No wheezing or rales.  ?Abdominal:  ?   General: Bowel sounds are normal. There is no distension.  ?   Palpations: Abdomen is soft.  ?   Tenderness: There is no abdominal tenderness.  ?Musculoskeletal:     ?   General: Normal range of motion.  ?   Cervical back: Normal range of motion and neck supple.  ?Skin: ?   General: Skin is warm and dry.  ?Neurological:  ?   Mental Status: He is alert and oriented to person, place, and time.  ?   Coordination: Coordination normal.  ? ? ?ED Results / Procedures / Treatments   ?Labs ?(all labs ordered are listed, but only abnormal results are displayed) ?Labs Reviewed - No data to display ? ?EKG ?None ? ?Radiology ?No results found. ? ?Procedures ?Procedures  ? ? ? ?Medications Ordered in ED ?Medications - No data to display ? ?ED Course/ Medical Decision Making/ A&P ? ?Patient with history of Brugada syndrome with wireless defibrillator presenting with skin irritation where the  defibrillator lies.  He feels as though it is poking his skin and irritating it.  There is no tenting of the skin and no overlying redness or warmth.  Chest x-ray shows no abnormality.  Patient is not reporting any shocks coming from the defibrillator.  I feel as though he can be discharged and follow-up with his electrophysiologist if symptoms persist. ? ?Final Clinical Impression(s) / ED Diagnoses ?Final diagnoses:  ?None  ? ? ?Rx / DC Orders ?ED Discharge Orders   ? ? None  ? ?  ? ? ?  ?Veryl Speak, MD ?07/04/21 [phone] ? ?

## 2021-07-04 NOTE — ED Triage Notes (Signed)
Pt has a defibrillator that was placed 3 years ago and over the last couple of days the presents of it is irritating his skin. He said that the only comfortable position is laying flat on his back ?

## 2021-07-21 ENCOUNTER — Ambulatory Visit (INDEPENDENT_AMBULATORY_CARE_PROVIDER_SITE_OTHER): Payer: Self-pay

## 2021-07-21 DIAGNOSIS — R55 Syncope and collapse: Secondary | ICD-10-CM

## 2021-07-22 LAB — CUP PACEART REMOTE DEVICE CHECK
Date Time Interrogation Session: 20230401002907
Implantable Pulse Generator Implant Date: 20191122

## 2021-08-05 NOTE — Progress Notes (Signed)
Carelink Summary Report / Loop Recorder 

## 2021-08-08 IMAGING — DX DG ANKLE COMPLETE LEFT
3 series · 3 of 3 positions shown · non-contrast
Comparison: None.

CLINICAL DATA: Left ankle pain for 2 days, initial encounter

EXAM:
LEFT ANKLE COMPLETE - 3+ VIEW

[ankle ap]
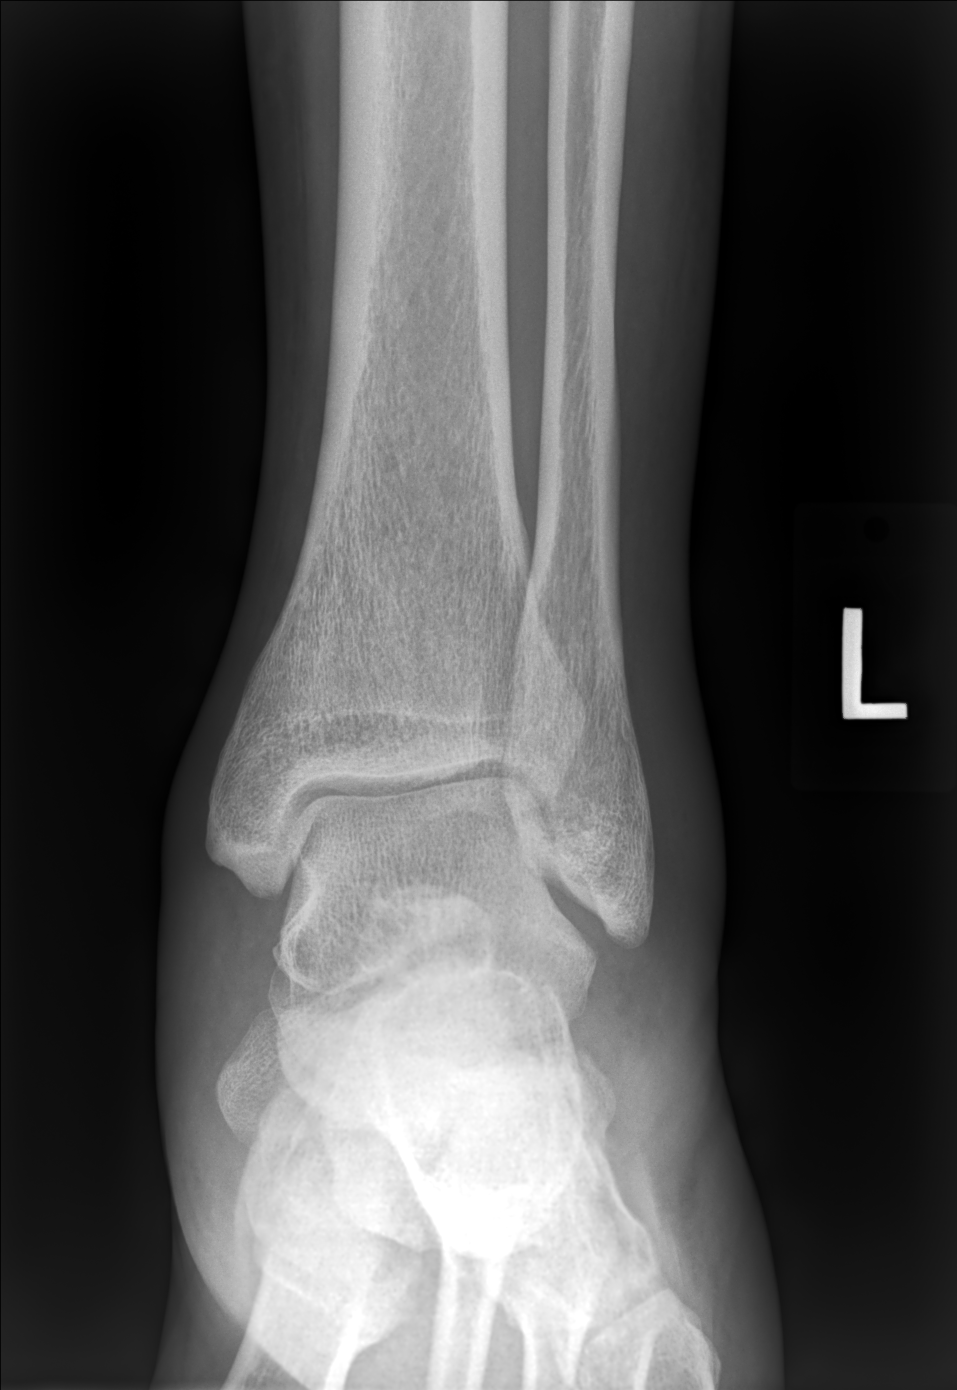

[ankle medial oblique]
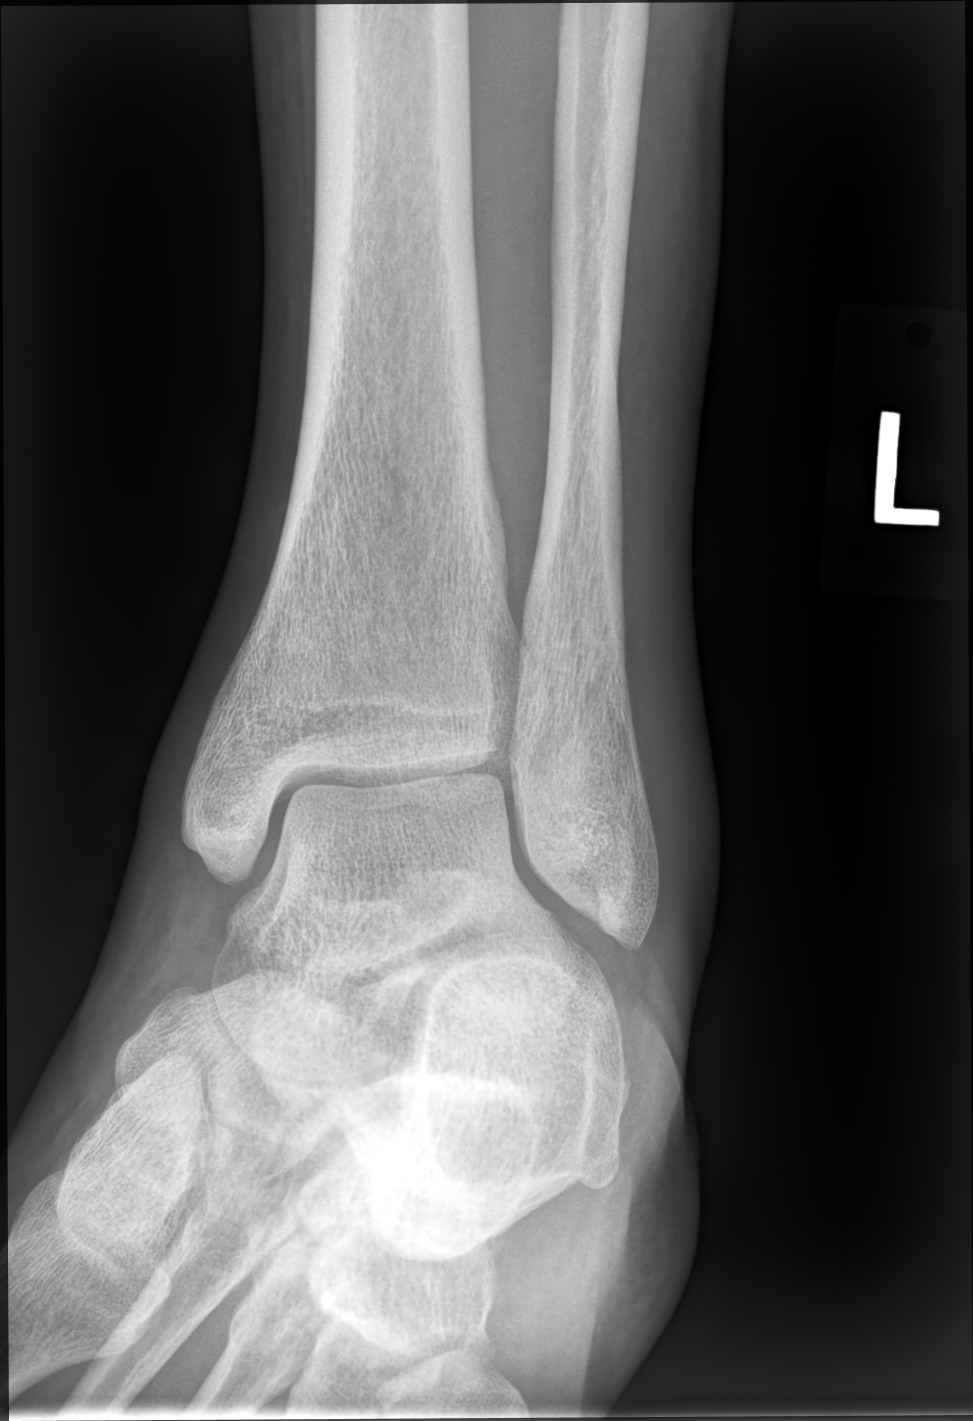

[ankle lat]
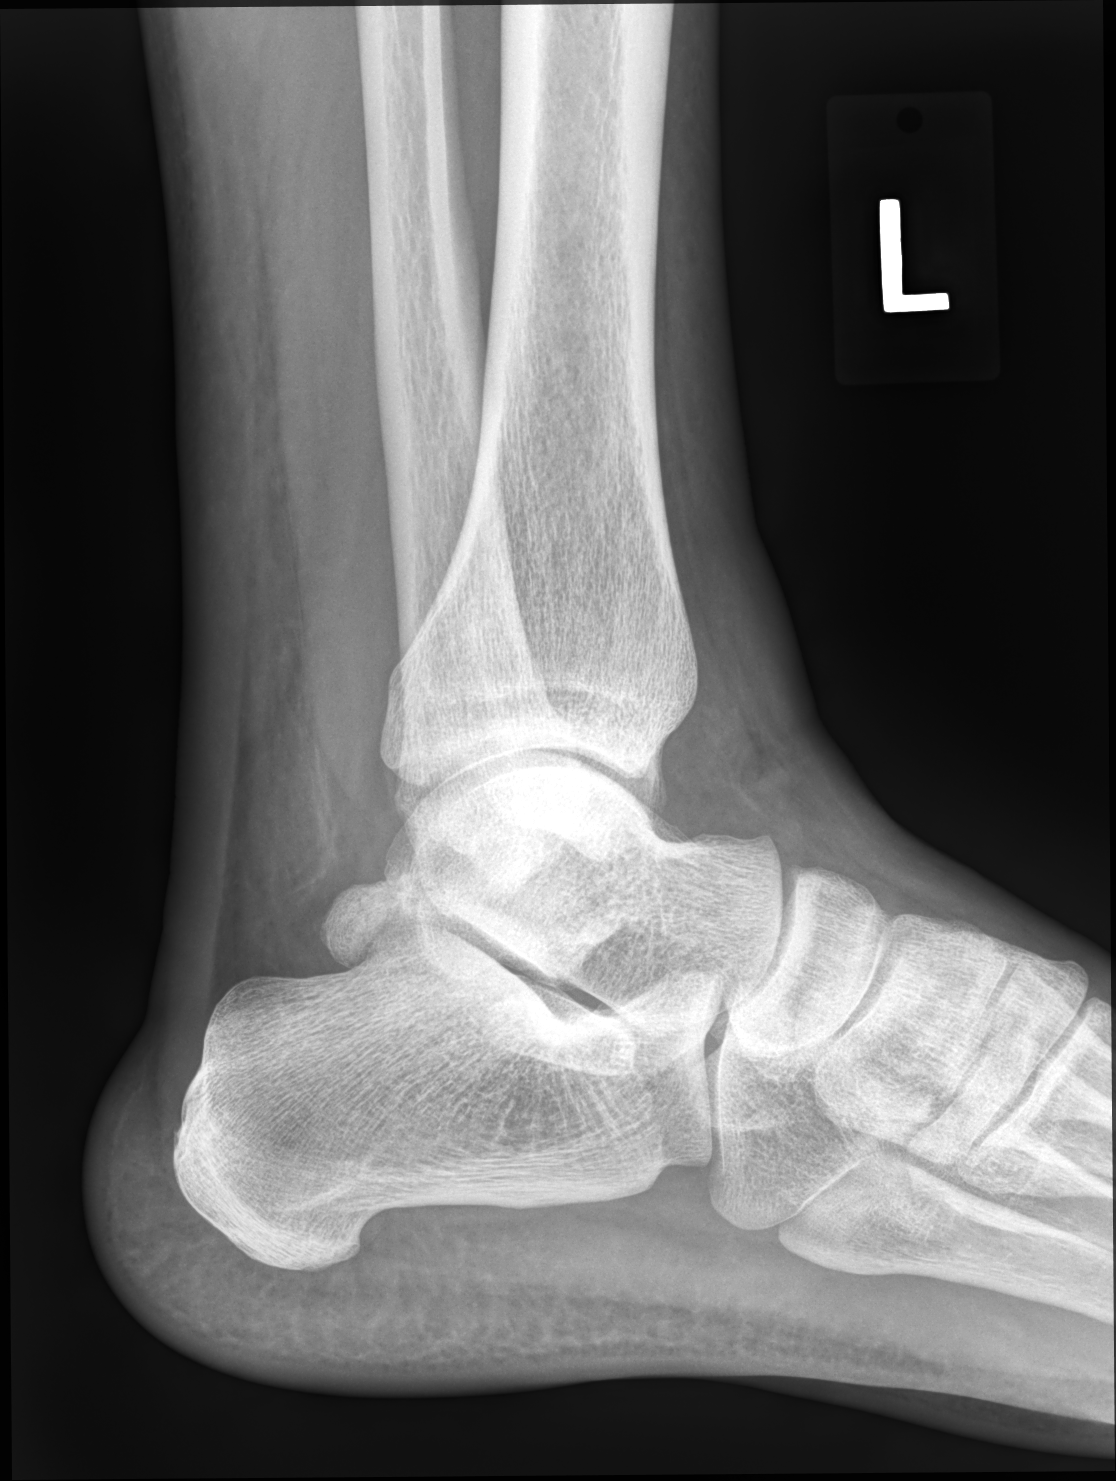

[3 of 3 positions shown; findings below may reference images not displayed]

FINDINGS: Generalized soft tissue swelling is identified about the ankle. No
acute fracture or dislocation is noted. No other focal abnormality
is noted.
IMPRESSION: Soft tissue swelling without acute bony abnormality.

## 2021-08-21 LAB — CUP PACEART REMOTE DEVICE CHECK
Date Time Interrogation Session: 20230504002931
Implantable Pulse Generator Implant Date: 20191122

## 2021-08-25 ENCOUNTER — Ambulatory Visit (INDEPENDENT_AMBULATORY_CARE_PROVIDER_SITE_OTHER): Payer: Self-pay

## 2021-08-25 DIAGNOSIS — R55 Syncope and collapse: Secondary | ICD-10-CM

## 2021-09-12 NOTE — Progress Notes (Signed)
Carelink Summary Report / Loop Recorder 

## 2021-09-23 ENCOUNTER — Telehealth: Payer: Self-pay

## 2021-09-23 NOTE — Telephone Encounter (Signed)
ILR has reached RRT 09/20/21. Attempted to contact pt to notify. No answer and unable to leave VM due to not set up.   Marked "I" in [employer]. Discontinued from [employer] canceled

## 2021-09-24 ENCOUNTER — Telehealth: Payer: Self-pay

## 2021-09-24 NOTE — Telephone Encounter (Addendum)
LINQ RRT 2nd attempt to reach patient  Unable to leave voicemail at patients provided number, also attempted to call emergency contact Cheri Rous ok per DPR signed 2021.

## 2021-09-25 NOTE — Telephone Encounter (Signed)
2nd attempt to contact patient. No answer, unable to leave VM.  

## 2021-09-26 NOTE — Telephone Encounter (Signed)
Third unsuccessful attempt to contact patient to discuss LINQ at RRT. Unable to leave message as "call cannot be completed at this time". Certified letter sent.

## 2021-10-28 ENCOUNTER — Encounter: Payer: Self-pay | Admitting: Internal Medicine

## 2021-10-31 ENCOUNTER — Encounter: Payer: Self-pay | Admitting: Internal Medicine

## 2021-11-20 ENCOUNTER — Encounter: Payer: Self-pay | Admitting: Internal Medicine

## 2021-12-01 ENCOUNTER — Encounter: Payer: Self-pay | Admitting: Internal Medicine

## 2021-12-01 ENCOUNTER — Ambulatory Visit (INDEPENDENT_AMBULATORY_CARE_PROVIDER_SITE_OTHER): Payer: Self-pay | Admitting: Internal Medicine

## 2021-12-01 VITALS — BP 100/68 | HR 63 | Ht 66.0 in | Wt 185.2 lb

## 2021-12-01 DIAGNOSIS — I498 Other specified cardiac arrhythmias: Secondary | ICD-10-CM

## 2021-12-01 DIAGNOSIS — Z9889 Other specified postprocedural states: Secondary | ICD-10-CM

## 2021-12-01 DIAGNOSIS — R55 Syncope and collapse: Secondary | ICD-10-CM

## 2021-12-01 NOTE — Progress Notes (Signed)
Going to [facility] to patient ID: [name] [name], male   DOB: 08-Sep-1980, 41 y.o.   MRN: [id number]      Patient Care Team: Patient, No Pcp Per as PCP - General (General Practice)   HPI  [name] [name] is a 41 y.o. male Seen in followup for syncope with Brugada type 2 ECG with prior loop recorder implantation.  After discussions with Dr. Genevie Ann and Priori (see note 03/07/2018) it was elected to proceed with a loop recorder implantation for evaluation of possible arrhythmic syncope  2 symptomatic recordings were noted with 12/22.  Associated with sinus rhythm.  Symptoms were transient lightheadedness   The patient denies chest pain, shortness of breath, nocturnal dyspnea, orthopnea or peripheral edema.  There have been no palpitations  or syncope.   Kain son is 10     Date   Cr              K         Hgb   3/10 1.4 3.9         16.0   11/19  1.16 4.2            13.6     Past Medical History:  Diagnosis Date   Abnormal EKG    Brugada syndrome     Past Surgical History:  Procedure Laterality Date   ELECTROPHYSIOLOGY STUDY N/A 03/11/2018   Procedure: ELECTROPHYSIOLOGY STUDY;  Surgeon: Duke Salvia, MD;  Location: [facility] INVASIVE CV LAB;  Service: Cardiovascular;  Laterality: N/A;   LOOP RECORDER INSERTION N/A 03/11/2018   Procedure: LOOP RECORDER INSERTION;  Surgeon: Duke Salvia, MD;  Location: [facility] INVASIVE CV LAB;  Service: Cardiovascular;  Laterality: N/A;    No outpatient medications have been marked as taking for the 12/01/21 encounter (Office Visit) with Duke Salvia, MD.    Allergies  Allergen Reactions   Bee Venom Anaphylaxis    Review of Systems negative except from HPI and PMH  Physical Exam: BP 100/68   Pulse 63   Ht 5\' 6"  (1.676 m)   Wt 185 lb 3.2 oz (84 kg)   BMI 29.89 kg/m  Well developed and well nourished in no acute distress HENT normal Neck supple with JVP-flat Clear Device pocket well healed; without hematoma or erythema.  There is no  tethering  Regular rate and rhythm, no murmur Abd-soft with active BS No Clubbing cyanosis  edema Skin-warm and dry A & Oriented  Grossly normal sensory and motor function  ECG sinus at 63 Intervals 18/08/39 ST segment elevation with some saddleback in lead V1  Assessment and  Plan:  Brugada type II ECG  Loop recorder at ERI   Syncopal episode abrupt onset   \Cigarette use  No interval syncope.  His loop recorder is at ERI.  For right now we will hold, we will anticipate when affordable, time and place his Linq with a long-acting monitor.  Encouraged him to stop smoking

## 2021-12-01 NOTE — Patient Instructions (Signed)

## 2021-12-08 ENCOUNTER — Telehealth: Payer: Self-pay | Admitting: Licensed Clinical Social Worker

## 2021-12-08 NOTE — Progress Notes (Signed)
Heart and Vascular Care Navigation  12/08/2021  [name] [name] 1981-02-16 [id number]  Reason for Referral: no insurance, no PCP Patient is participating in a Managed Medicaid Plan: No, self pay only.  Engaged with patient by telephone for initial visit for Heart and Vascular Care Coordination.                                                                                                   Assessment:    LCSW initially reached a friend of pt who shared new number and I was able to speak with pt this afternoon at [phone]. Introduced self, role, reason for call. 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 He shares that he believes his new job which he is waiting for background check to begin, should include insurance coverage. He is amenable to me sending [facility] Financial Assistance application as well as PCP list as no current PCP. Pt was on a job so he didn't further elaborate other needs at this time but was encouraged to contact me with any additional questions or concerns.                                      HRT/VAS Care Coordination     Patients Home Cardiology Office [facility]   Outpatient Care Team Social Worker   Social Worker Name: Nile Riggs, [place] [facility] [phone]   Living arrangements for the past 2 months Single Family Home   Lives with: Self   Patient Current Insurance Coverage Self-Pay   Patient Has Concern With Paying Medical Bills Yes   Patient Concerns With Medical Bills has been uninsured, states he will have insurance with his new job   Medical Bill Referrals: mailed [employer] in case no insurance with new job/past due bills can be covered   Does Patient Have Prescription Coverage? No  only taking tylenol and advil per chart       Social History:                                                                             SDOH Screenings   Alcohol  Screen: Not on file  Depression (KNL9-7): Not on file  Financial Resource Strain: Medium Risk (12/08/2021)   Overall Financial Resource Strain (CARDIA)    Difficulty of Paying Living Expenses: Somewhat hard  Food Insecurity: Not on file  Housing: Not on file  Physical Activity: Not on file  Social Connections: Not on file  Stress: Not on file  Tobacco Use: High Risk (12/01/2021)   Patient History    Smoking Tobacco Use: Every Day    Smokeless Tobacco Use: Never  Passive Exposure: Not on file  Transportation Needs: Not on file    SDOH Interventions: Financial Resources:  Financial Strain Interventions: Other (Comment) (mailed [employer] Assistance application; should be getting insurance through his new job) Editor, 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 I will f/u with pt in a few weeks to see if his insurance has kicked in/he has any further clarity and answer any additional questions/concerns.

## 2021-12-26 ENCOUNTER — Telehealth: Payer: Self-pay | Admitting: Licensed Clinical Social Worker

## 2021-12-26 NOTE — Telephone Encounter (Signed)
H&V Care Navigation CSW Progress Note  Clinical Social Worker contacted patient by phone to f/u on coverage and assistance applications sent to pt. Was able to reach him at [phone]. Re-introduced self, role, reason for call. 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 Pt confirmed he has received applications but doesn't think he needs to complete them at this time. I encouraged him to reach out to our team should he have any additional questions/concerns as we will not actively follow at this time. Pt had also been sent PCP list and encouraged to establish. No additional questions voiced by pt at this time.  Patient is participating in a Managed Medicaid Plan:  No, self pay only. States he will have commercial insurance shortly  SDOH Screenings   Financial Resource Strain: Medium Risk (12/08/2021)  Tobacco Use: High Risk (12/01/2021)   Octavio Graves, MSW, LCSW Clinical Social Worker II [facility] Heart/Vascular Care Navigation  [phone]- work cell phone (preferred) [phone]- desk phone
